# Patient Record
Sex: Male | Born: 1950 | Race: White | Hispanic: No | Marital: Married | Smoking: Never smoker
Health system: Southern US, Community
[De-identification: ages and names within clinical notes are randomized; demographics above are authoritative.]

## PROBLEM LIST (undated history)

## (undated) DIAGNOSIS — F319 Bipolar disorder, unspecified: Secondary | ICD-10-CM

## (undated) DIAGNOSIS — E785 Hyperlipidemia, unspecified: Secondary | ICD-10-CM

## (undated) DIAGNOSIS — I10 Essential (primary) hypertension: Secondary | ICD-10-CM

## (undated) DIAGNOSIS — E119 Type 2 diabetes mellitus without complications: Secondary | ICD-10-CM

## (undated) DIAGNOSIS — G4730 Sleep apnea, unspecified: Secondary | ICD-10-CM

## (undated) DIAGNOSIS — E039 Hypothyroidism, unspecified: Secondary | ICD-10-CM

## (undated) DIAGNOSIS — F329 Major depressive disorder, single episode, unspecified: Secondary | ICD-10-CM

## (undated) DIAGNOSIS — F32A Depression, unspecified: Secondary | ICD-10-CM

## (undated) HISTORY — PX: CHOLECYSTECTOMY: SHX55

---

## 2004-10-04 ENCOUNTER — Ambulatory Visit: Payer: Self-pay | Admitting: Internal Medicine

## 2005-01-05 ENCOUNTER — Ambulatory Visit: Payer: Self-pay | Admitting: Occupational Therapy

## 2010-09-05 ENCOUNTER — Inpatient Hospital Stay: Payer: Self-pay | Admitting: Unknown Physician Specialty

## 2011-03-08 ENCOUNTER — Inpatient Hospital Stay: Payer: Self-pay | Admitting: Unknown Physician Specialty

## 2011-11-04 ENCOUNTER — Inpatient Hospital Stay: Payer: Self-pay | Admitting: Psychiatry

## 2012-02-21 ENCOUNTER — Ambulatory Visit: Payer: Self-pay | Admitting: Nurse Practitioner

## 2012-06-23 ENCOUNTER — Ambulatory Visit: Payer: Self-pay | Admitting: Orthopedic Surgery

## 2012-11-08 IMAGING — CR DG KNEE COMPLETE 4+V*L*
1 series · 5 of 5 positions shown · non-contrast
Comparison: none

REASON FOR EXAM: bil knee pain
COMMENTS:

PROCEDURE:     KDR - KDXR KNEE LT COMP WITH OBLIQUES  - June 23, 2012 [DATE]
RESULT:     Comparison: None.

[Series 1: ap · 0.17mm/px · 5 of 5 slices shown]
[im 1/5]
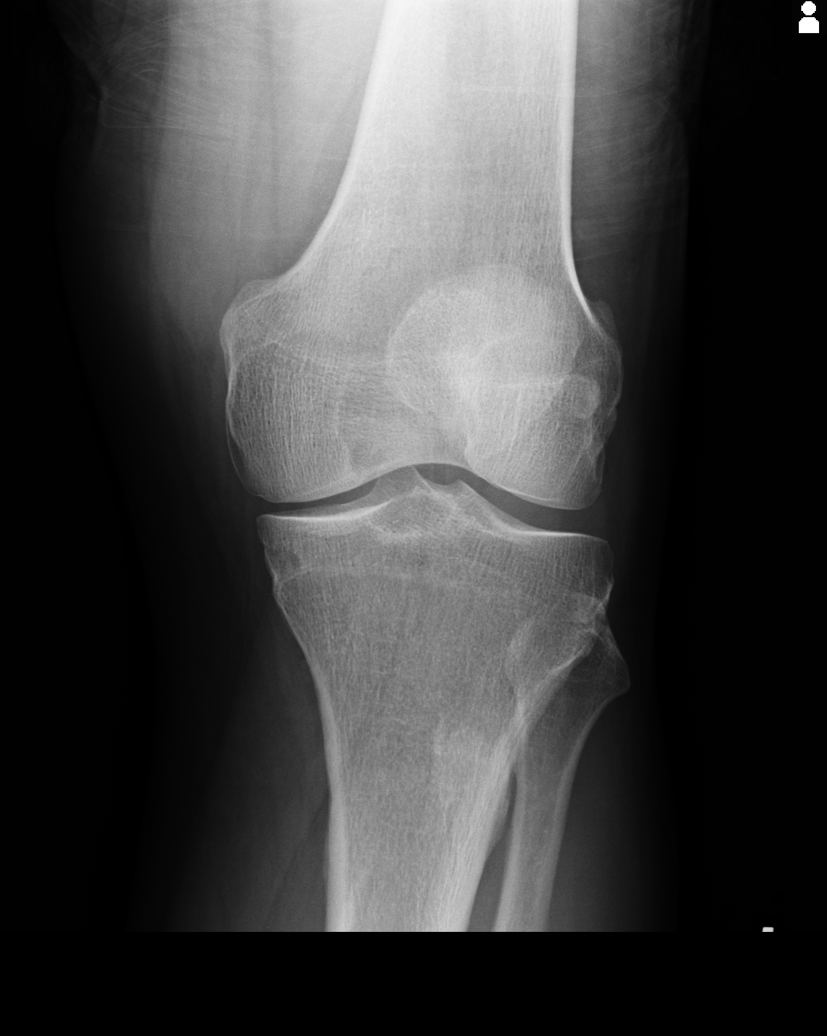
[im 2/5]
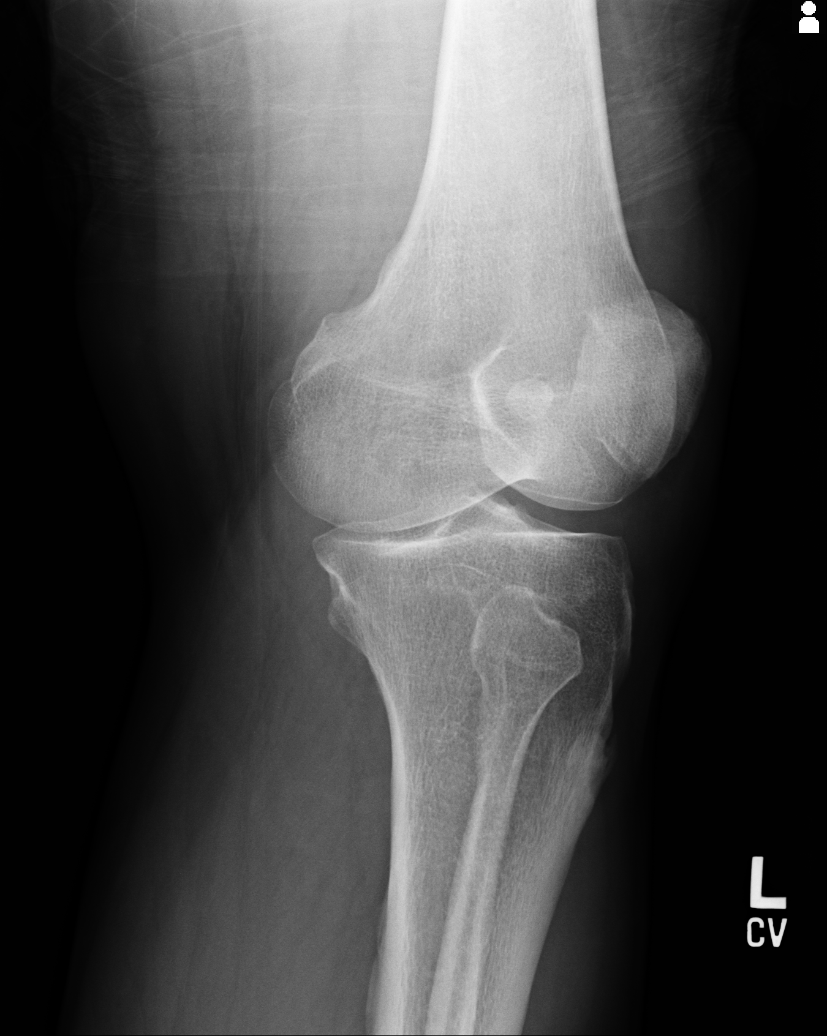
[im 3/5]
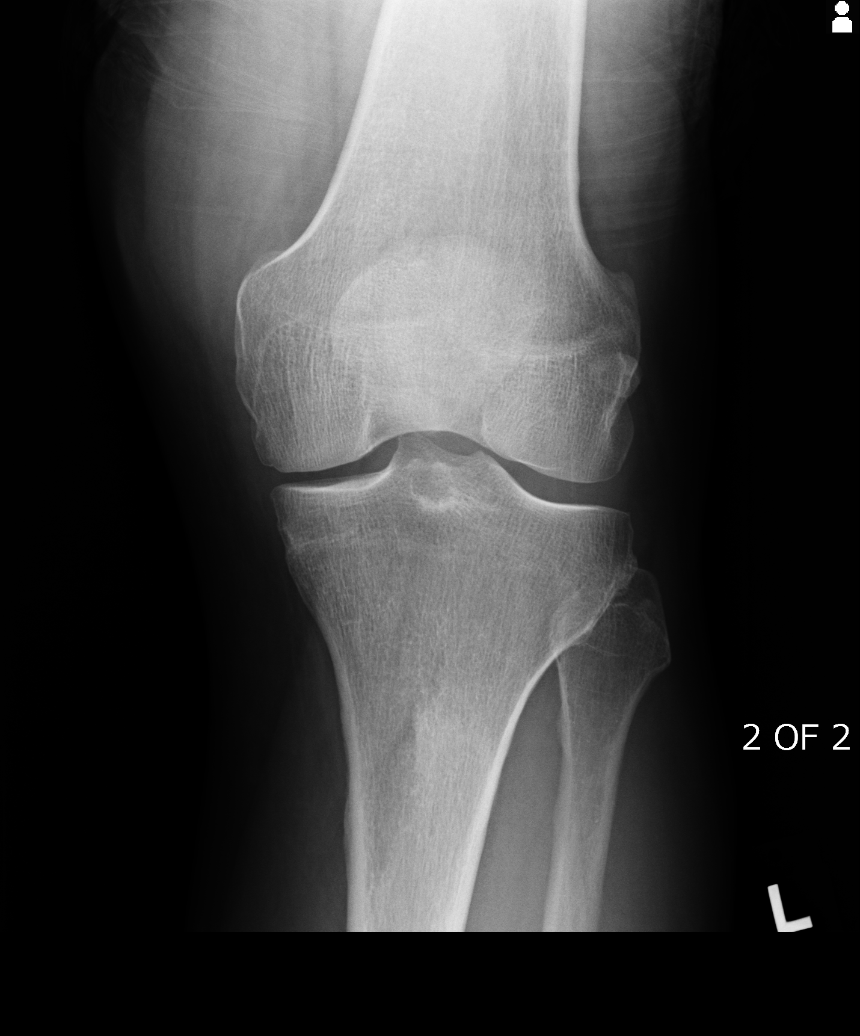
[im 4/5]
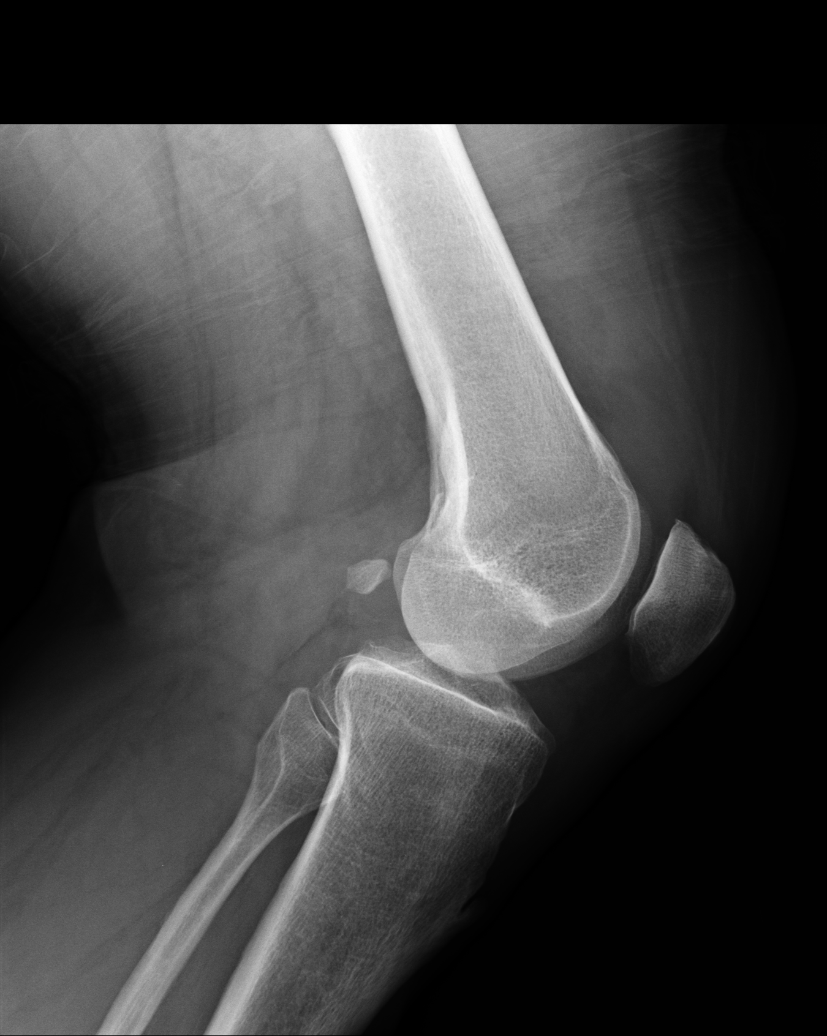
[im 5/5]
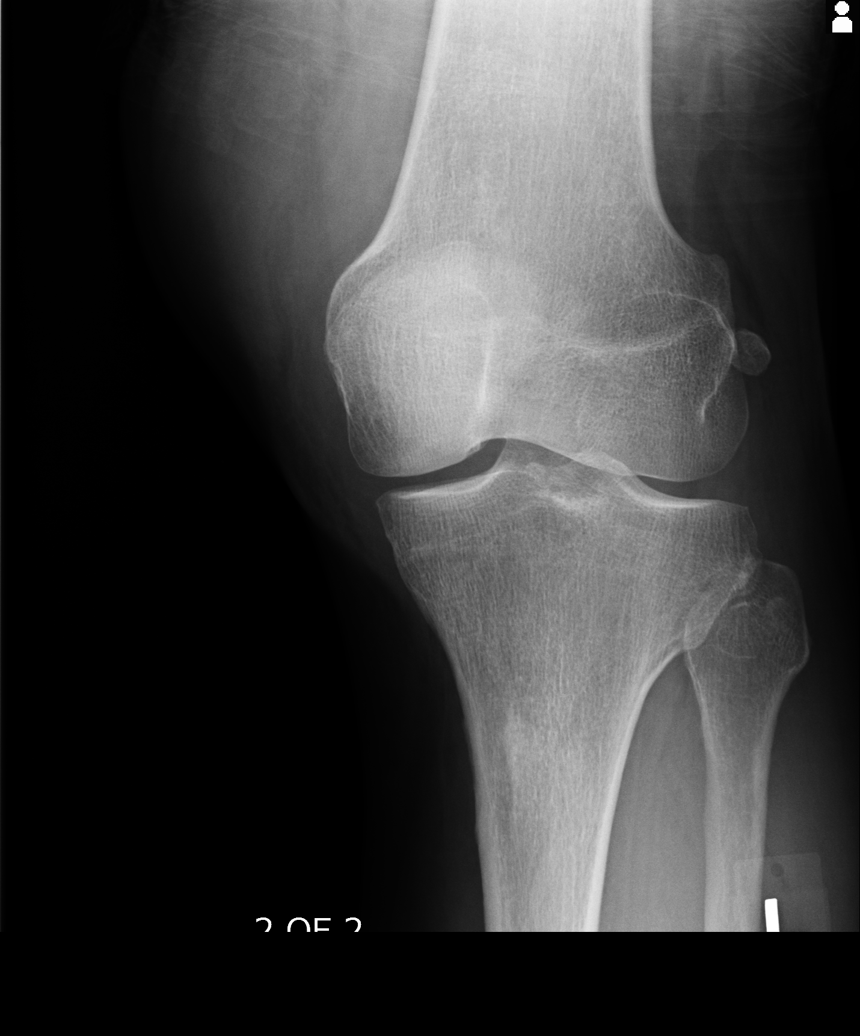

[5 of 5 positions shown; findings below may reference images not displayed]

FINDINGS: No acute fracture. The joint spaces are relatively preserved. There is a
small knee effusion.
IMPRESSION: Small knee effusion.

[REDACTED]

## 2012-11-08 IMAGING — CR DG KNEE COMPLETE 4+V*R*
1 series · 4 of 4 positions shown · non-contrast
Comparison: none

REASON FOR EXAM: bil knee pain
COMMENTS:

PROCEDURE:     KDR - KDXR KNEE RT COMP WITH OBLIQUES  - June 23, 2012 [DATE]
RESULT:     Comparison: None.

[Series 1: ap · 0.17mm/px · 4 of 4 slices shown]
[im 1/4]
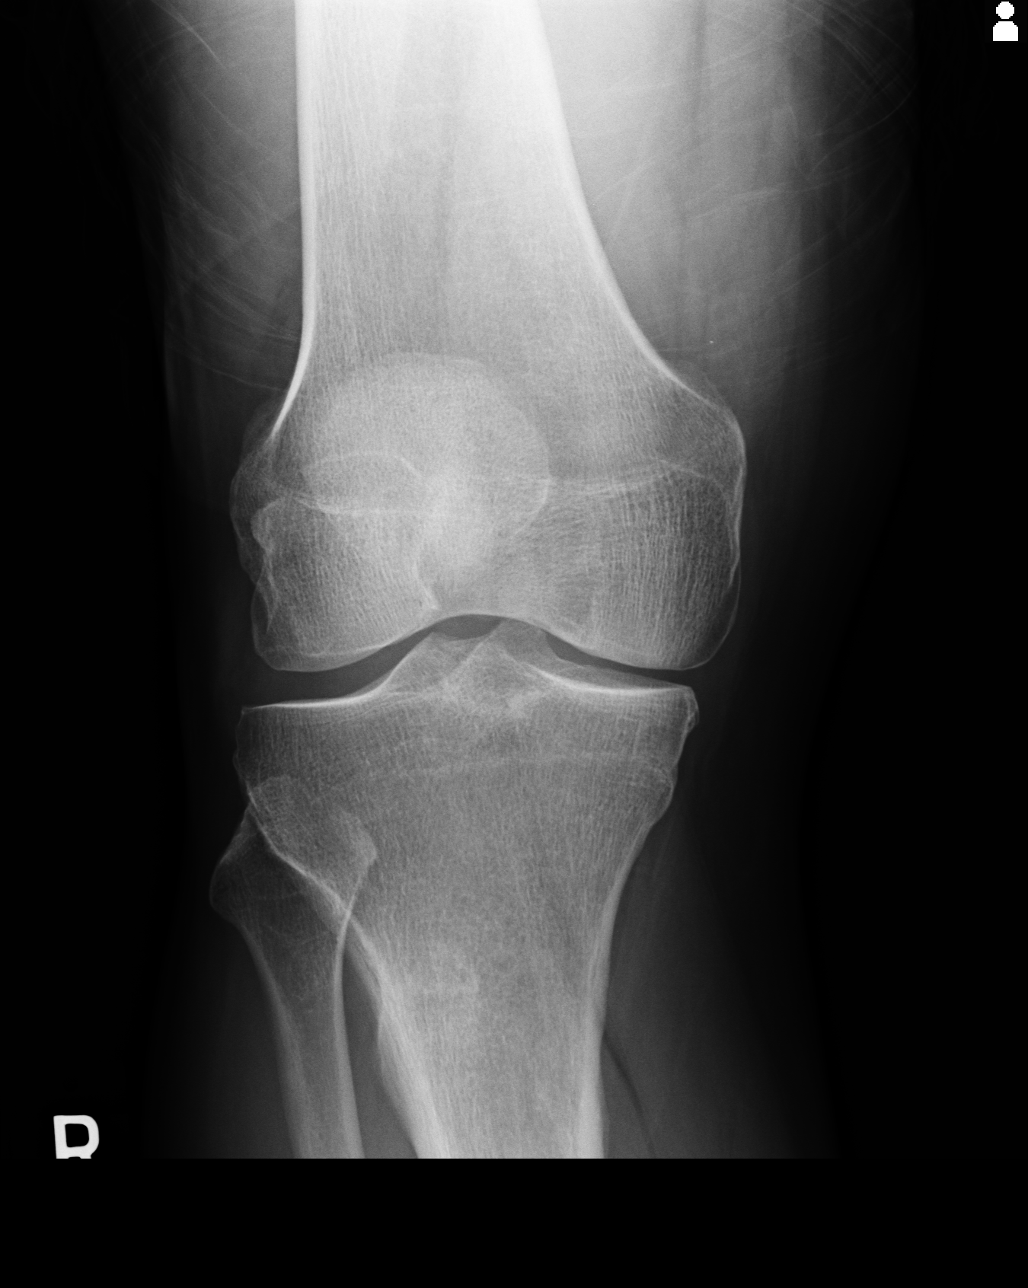
[im 2/4]
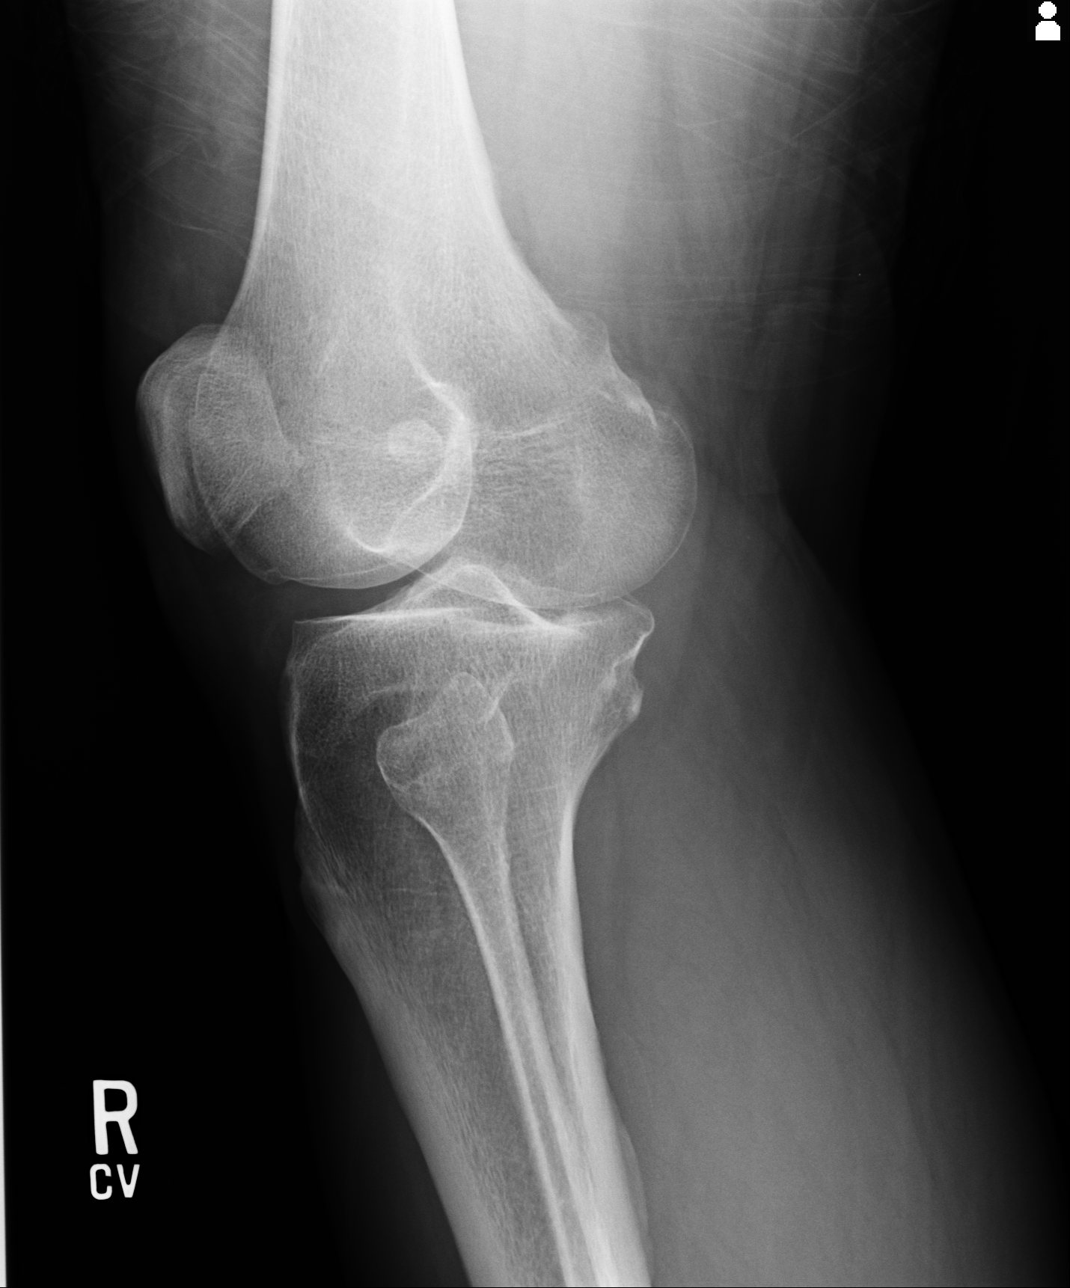
[im 3/4]
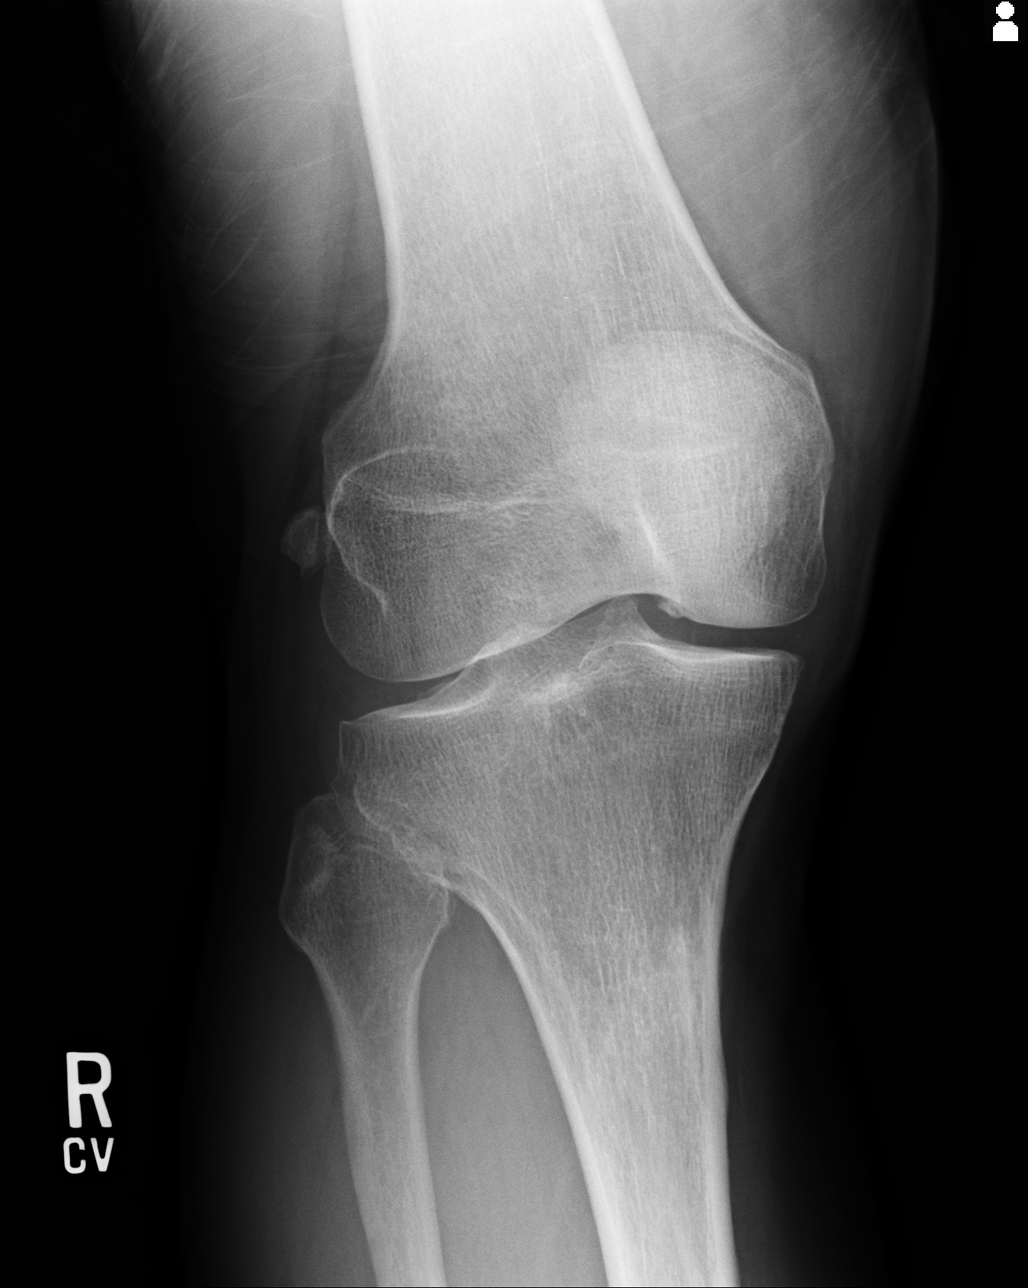
[im 4/4]
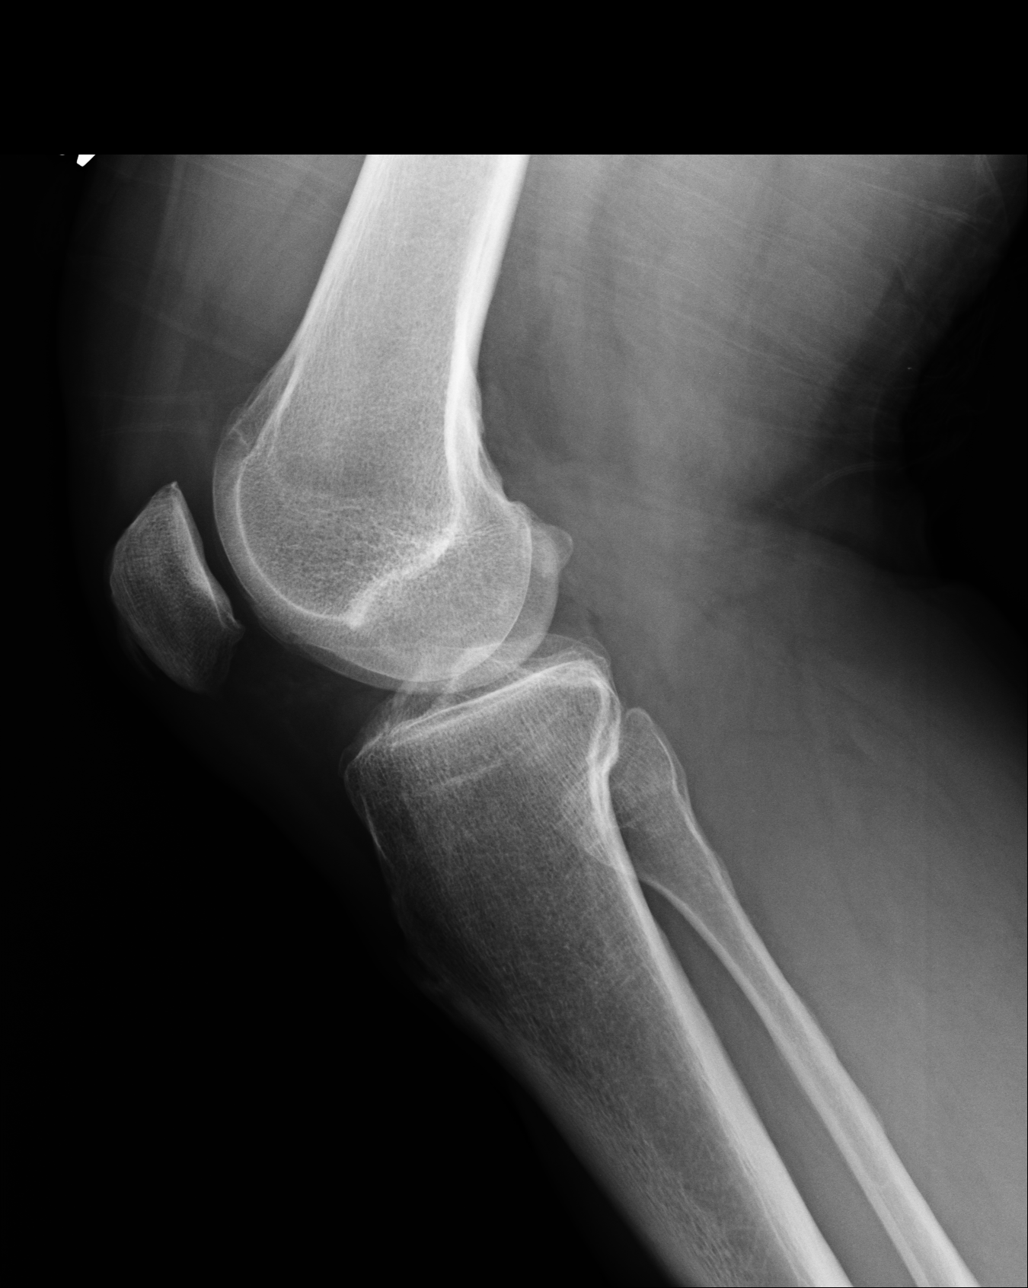

[4 of 4 positions shown; findings below may reference images not displayed]

FINDINGS: No acute fracture. There is a small osteophyte along the lateral aspect of
the medial femoral condyle. The joint spaces are relatively preserved.
Indeterminate for effusion.
IMPRESSION: Mild degenerative changes in the medial compartment.

[REDACTED].

## 2017-05-11 ENCOUNTER — Encounter: Payer: Self-pay | Admitting: *Deleted

## 2017-05-12 ENCOUNTER — Ambulatory Visit
Admission: RE | Admit: 2017-05-12 | Discharge: 2017-05-12 | Disposition: A | Discharge status: Home / Self Care | Payer: Medicare Other | Source: Ambulatory Visit | Attending: Unknown Physician Specialty | Admitting: Unknown Physician Specialty

## 2017-05-12 ENCOUNTER — Ambulatory Visit: Payer: Medicare Other | Admitting: Anesthesiology

## 2017-05-12 ENCOUNTER — Encounter: Payer: Self-pay | Admitting: *Deleted

## 2017-05-12 ENCOUNTER — Encounter
Admission: RE | Disposition: A | Discharge status: Home / Self Care | Payer: Self-pay | Source: Ambulatory Visit | Attending: Unknown Physician Specialty

## 2017-05-12 DIAGNOSIS — Z794 Long term (current) use of insulin: Secondary | ICD-10-CM | POA: Diagnosis not present

## 2017-05-12 DIAGNOSIS — D374 Neoplasm of uncertain behavior of colon: Secondary | ICD-10-CM | POA: Insufficient documentation

## 2017-05-12 DIAGNOSIS — E119 Type 2 diabetes mellitus without complications: Secondary | ICD-10-CM | POA: Insufficient documentation

## 2017-05-12 DIAGNOSIS — Z7982 Long term (current) use of aspirin: Secondary | ICD-10-CM | POA: Diagnosis not present

## 2017-05-12 DIAGNOSIS — G4730 Sleep apnea, unspecified: Secondary | ICD-10-CM | POA: Insufficient documentation

## 2017-05-12 DIAGNOSIS — F319 Bipolar disorder, unspecified: Secondary | ICD-10-CM | POA: Diagnosis not present

## 2017-05-12 DIAGNOSIS — Z79899 Other long term (current) drug therapy: Secondary | ICD-10-CM | POA: Insufficient documentation

## 2017-05-12 DIAGNOSIS — Z1211 Encounter for screening for malignant neoplasm of colon: Secondary | ICD-10-CM | POA: Diagnosis present

## 2017-05-12 DIAGNOSIS — Z9989 Dependence on other enabling machines and devices: Secondary | ICD-10-CM | POA: Insufficient documentation

## 2017-05-12 DIAGNOSIS — I10 Essential (primary) hypertension: Secondary | ICD-10-CM | POA: Diagnosis not present

## 2017-05-12 DIAGNOSIS — E785 Hyperlipidemia, unspecified: Secondary | ICD-10-CM | POA: Insufficient documentation

## 2017-05-12 DIAGNOSIS — K640 First degree hemorrhoids: Secondary | ICD-10-CM | POA: Insufficient documentation

## 2017-05-12 DIAGNOSIS — Z8719 Personal history of other diseases of the digestive system: Secondary | ICD-10-CM | POA: Diagnosis not present

## 2017-05-12 HISTORY — DX: Major depressive disorder, single episode, unspecified: F32.9

## 2017-05-12 HISTORY — DX: Essential (primary) hypertension: I10

## 2017-05-12 HISTORY — DX: Depression, unspecified: F32.A

## 2017-05-12 HISTORY — DX: Sleep apnea, unspecified: G47.30

## 2017-05-12 HISTORY — DX: Bipolar disorder, unspecified: F31.9

## 2017-05-12 HISTORY — PX: COLONOSCOPY WITH PROPOFOL: SHX5780

## 2017-05-12 HISTORY — DX: Hyperlipidemia, unspecified: E78.5

## 2017-05-12 HISTORY — DX: Type 2 diabetes mellitus without complications: E11.9

## 2017-05-12 LAB — GLUCOSE, CAPILLARY
Glucose-Capillary: 127 mg/dL — ABNORMAL HIGH (ref 65–99)
Glucose-Capillary: 110 mg/dL — ABNORMAL HIGH (ref 65–99)

## 2017-05-12 SURGERY — COLONOSCOPY WITH PROPOFOL
Anesthesia: General

## 2017-05-12 MED ORDER — PROPOFOL 500 MG/50ML IV EMUL
INTRAVENOUS | Status: DC | PRN
  Administered 2017-05-12: 170 ug/kg/min via INTRAVENOUS

## 2017-05-12 MED ORDER — PROPOFOL 500 MG/50ML IV EMUL
INTRAVENOUS | Status: AC
  Filled 2017-05-12: qty 50

## 2017-05-12 MED ORDER — LIDOCAINE 2% (20 MG/ML) 5 ML SYRINGE
INTRAMUSCULAR | Status: DC | PRN
  Administered 2017-05-12: 40 mg via INTRAVENOUS

## 2017-05-12 MED ORDER — MIDAZOLAM HCL 5 MG/5ML IJ SOLN
INTRAMUSCULAR | Status: DC | PRN
  Administered 2017-05-12: 1 mg via INTRAVENOUS

## 2017-05-12 MED ORDER — FENTANYL CITRATE (PF) 100 MCG/2ML IJ SOLN
INTRAMUSCULAR | Status: DC | PRN
  Administered 2017-05-12: 50 ug via INTRAVENOUS

## 2017-05-12 MED ORDER — PROPOFOL 10 MG/ML IV BOLUS
INTRAVENOUS | Status: DC | PRN
  Administered 2017-05-12: 100 mg via INTRAVENOUS

## 2017-05-12 MED ORDER — MIDAZOLAM HCL 2 MG/2ML IJ SOLN
INTRAMUSCULAR | Status: AC
  Filled 2017-05-12: qty 2

## 2017-05-12 MED ORDER — SODIUM CHLORIDE 0.9 % IV SOLN
INTRAVENOUS | Status: DC
  Administered 2017-05-12 (×2): via INTRAVENOUS

## 2017-05-12 MED ORDER — SODIUM CHLORIDE 0.9 % IV SOLN: INTRAVENOUS | Status: DC

## 2017-05-12 MED ORDER — FENTANYL CITRATE (PF) 100 MCG/2ML IJ SOLN
INTRAMUSCULAR | Status: AC
  Filled 2017-05-12: qty 2

## 2017-05-12 NOTE — Anesthesia Preprocedure Evaluation (Signed)
Anesthesia Evaluation  Patient identified by MRN, date of birth, ID band Patient awake    Reviewed: Allergy & Precautions, NPO status , Patient's Chart, lab work & pertinent test results  History of Anesthesia Complications Negative for: history of anesthetic complications  Airway Mallampati: III       Dental  (+) Missing, Chipped   Pulmonary sleep apnea and Continuous Positive Airway Pressure Ventilation ,           Cardiovascular hypertension, Pt. on medications      Neuro/Psych Depression Bipolar Disorder negative neurological ROS     GI/Hepatic negative GI ROS, Neg liver ROS,   Endo/Other  diabetes, Type 2, Oral Hypoglycemic Agents, Insulin Dependent  Renal/GU negative Renal ROS     Musculoskeletal   Abdominal   Peds  Hematology negative hematology ROS (+)   Anesthesia Other Findings   Reproductive/Obstetrics                             Anesthesia Physical Anesthesia Plan  ASA: III  Anesthesia Plan: General   Post-op Pain Management:    Induction: Intravenous  Airway Management Planned: Nasal Cannula  Additional Equipment:   Intra-op Plan:   Post-operative Plan:   Informed Consent: I have reviewed the patients History and Physical, chart, labs and discussed the procedure including the risks, benefits and alternatives for the proposed anesthesia with the patient or authorized representative who has indicated his/her understanding and acceptance.     Plan Discussed with:   Anesthesia Plan Comments:         Anesthesia Quick Evaluation

## 2017-05-12 NOTE — Transfer of Care (Signed)
Immediate Anesthesia Transfer of Care Note  Patient: John David  Procedure(s) Performed: Procedure(s): COLONOSCOPY WITH PROPOFOL (N/A)  Patient Location: PACU and Endoscopy Unit  Anesthesia Type:General  Level of Consciousness: sedated  Airway & Oxygen Therapy: Patient Spontanous Breathing and Patient connected to nasal cannula oxygen  Post-op Assessment: Report given to RN and Post -op Vital signs reviewed and stable  Post vital signs: Reviewed and stable  Last Vitals:  Vitals:   05/12/17 0903  BP: (!) 147/93  Pulse: 95  Resp: 18  Temp: 36.3 C    Last Pain:  Vitals:   05/12/17 0903  TempSrc: Tympanic         Complications: No apparent anesthesia complications

## 2017-05-12 NOTE — H&P (Signed)
Primary Care Physician:  Renee Rival, NP Primary Gastroenterologist:  Dr. Vira Agar  Pre-Procedure History & Physical: HPI:  John David is a 66 y.o. male is here for an colonoscopy.   Past Medical History:  Diagnosis Date  . Bipolar disorder ([place])   . Depression   . Diabetes mellitus without complication ([place])   . Hyperlipidemia   . Hypertension   . Sleep apnea     Past Surgical History:  Procedure Laterality Date  . CHOLECYSTECTOMY      Prior to Admission medications   Medication Sig Start Date End Date Taking? Authorizing Provider  Ascorbic Acid (VITAMIN C) 1000 MG tablet Take 1,000 mg by mouth daily.   Yes [provider]  aspirin EC 81 MG tablet Take 81 mg by mouth daily.   Yes [provider]  atorvastatin (LIPITOR) 40 MG tablet Take 40 mg by mouth daily.   Yes [provider]  Cinnamon 500 MG capsule Take 500 mg by mouth 2 (two) times daily. Cinnamon bark   Yes [provider]  Cinnamon Bark POWD by Does not apply route.   Yes [provider]  clonazePAM (KLONOPIN) 0.5 MG tablet Take 0.5 mg by mouth 2 (two) times daily as needed for anxiety.   Yes [provider]  Coenzyme Q10 100 MG TABS Take 1 tablet by mouth 2 (two) times daily.   Yes [provider]  enalapril (VASOTEC) 2.5 MG tablet Take 2.5 mg by mouth daily.   Yes [provider]  insulin detemir (LEVEMIR) 100 UNIT/ML injection Inject 100 Units into the skin at bedtime.   Yes [provider]  Insulin Lispro (HUMALOG KWIKPEN) 200 UNIT/ML SOPN Inject 40 Units into the skin 2 (two) times daily. With breakfast and dinner   Yes [provider]  insulin lispro (HUMALOG) 100 UNIT/ML KiwkPen Inject into the skin.   Yes [provider]  levothyroxine (SYNTHROID, LEVOTHROID) 25 MCG tablet Take 25 mcg by mouth daily before breakfast. Take on empty stomach with a glass of water at least 30-60 minutes before breakfast    Yes [provider]  Lysine 500 MG TABS Take 1 tablet by mouth 2 (two) times daily.   Yes [provider]  metFORMIN (GLUCOPHAGE) 1000 MG tablet Take 1,000 mg by mouth 2 (two) times daily with a meal.   Yes [provider]  Multiple Vitamins-Minerals (CENTRUM SILVER PO) Take 1 tablet by mouth daily.   Yes [provider]  OMEGA-3 FATTY ACIDS PO Take 1 capsule by mouth 2 (two) times daily. 340-1000mg    Yes [provider]  QUEtiapine (SEROQUEL) 200 MG tablet Take 200 mg by mouth at bedtime.   Yes [provider]  sertraline (ZOLOFT) 100 MG tablet Take 100 mg by mouth daily.   Yes [provider]  temazepam (RESTORIL) 30 MG capsule Take 30 mg by mouth at bedtime as needed for sleep.   Yes [provider]    Allergies as of 03/08/2017  . (Not on File)    History reviewed. No pertinent family history.  Social History   Social History  . Marital status: Married    Spouse name: N/A  . Number of children: N/A  . Years of education: N/A   Occupational History  . Not on file.   Social History Main Topics  . Smoking status: Never Smoker  . Smokeless tobacco: Never Used  . Alcohol use No  . Drug use: No  . Sexual  activity: Not on file   Other Topics Concern  . Not on file   Social History Narrative  . No narrative on file    Review of Systems: See HPI, otherwise negative ROS  Physical Exam: BP (!) 147/93   Pulse 95   Temp 97.4 F (36.3 C) (Tympanic)   Resp 18   Ht 5\' 10"  (1.778 m)   Wt 117.9 kg (260 lb)   SpO2 97%   BMI 37.31 kg/m  General:   Alert,  pleasant and cooperative in NAD Head:  Normocephalic and atraumatic. Neck:  Supple; no masses or thyromegaly. Lungs:  Clear throughout to auscultation.    Heart:  Regular rate and rhythm. Abdomen:  Soft, nontender and nondistended. Normal bowel sounds, without guarding, and without rebound.   Neurologic:  Alert and  oriented x4;  grossly normal  neurologically.  Impression/Plan: John David is here for an colonoscopy to be performed for [facility] colon polyps  Risks, benefits, limitations, and alternatives regarding  colonoscopy have been reviewed with the patient.  Questions have been answered.  All parties agreeable.   Gaylyn Cheers, MD  05/12/2017, 10:39 AM

## 2017-05-12 NOTE — Anesthesia Postprocedure Evaluation (Signed)
Anesthesia Post Note  Patient: John David  Procedure(s) Performed: Procedure(s) (LRB): COLONOSCOPY WITH PROPOFOL (N/A)  Patient location during evaluation: Endoscopy Anesthesia Type: General Level of consciousness: awake and alert Pain management: pain level controlled Vital Signs Assessment: post-procedure vital signs reviewed and stable Respiratory status: spontaneous breathing and respiratory function stable Cardiovascular status: stable Anesthetic complications: no     Last Vitals:  Vitals:   05/12/17 1120 05/12/17 1130  BP: 122/84   Pulse: 93 91  Resp: 13 (!) 26  Temp:      Last Pain:  Vitals:   05/12/17 1110  TempSrc: Tympanic                 Tynasia Mccaul K

## 2017-05-12 NOTE — Op Note (Signed)
[facility]astroenter[facility] Patient Name: John David Procedure Date: 05/12/2017 10:39 AM MRN: [id number] Account #: [id number] Date of Birth: 10/12/1951 Admit Type: Outpatient Age: 66 Room: [facility] ENDO ROOM 4 Gender: Male Note Status: Finalized Procedure:            Colonoscopy Indications:          High risk colon cancer surveillance: Personal history                        of colonic polyps Providers:            Manya Silvas, MD Referring MD:         Renee Rival (Referring MD) Medicines:            Propofol per Anesthesia Complications:        No immediate complications. Procedure:            Pre-Anesthesia Assessment:                       - After reviewing the risks and benefits, the patient                        was deemed in satisfactory condition to undergo the                        procedure.                       After obtaining informed consent, the colonoscope was                        passed under direct vision. Throughout the procedure,                        the patient's blood pressure, pulse, and oxygen                        saturations were monitored continuously. The                        Colonoscope was introduced through the anus and                        advanced to the the cecum, identified by appendiceal                        orifice and ileocecal valve. The colonoscopy was                        performed without difficulty. The patient tolerated the                        procedure well. The quality of the bowel preparation                        was good. Findings:      A small polyp was found in the proximal ascending colon. The polyp was       sessile. The polyp was removed with a cold snare. Resection and       retrieval were complete.      Internal hemorrhoids were found during  endoscopy. The hemorrhoids were       small and Grade I (internal hemorrhoids that do not prolapse).      The exam was otherwise  without abnormality. Impression:           - One small polyp in the proximal ascending colon,                        removed with a cold snare. Resected and retrieved.                       - Internal hemorrhoids.                       - The examination was otherwise normal. Recommendation:       - Await pathology results. Probably repeat in 5 years. Manya Silvas, MD 05/12/2017 11:09:08 AM This report has been signed electronically. Number of Addenda: 0 Note Initiated On: 05/12/2017 10:39 AM Scope Withdrawal Time: 0 hours 10 minutes 1 second  Total Procedure Duration: 0 hours 19 minutes 42 seconds       [facility]

## 2017-05-12 NOTE — Anesthesia Post-op Follow-up Note (Cosign Needed)
Anesthesia QCDR form completed.        

## 2017-05-13 ENCOUNTER — Encounter: Payer: Self-pay | Admitting: Unknown Physician Specialty

## 2017-05-13 LAB — SURGICAL PATHOLOGY

## 2022-09-02 ENCOUNTER — Other Ambulatory Visit: Payer: Self-pay

## 2022-09-02 ENCOUNTER — Emergency Department
Admission: EM | Admit: 2022-09-02 | Discharge: 2022-09-02 | Disposition: A | Discharge status: Home / Self Care | Payer: Medicare PPO | Source: Home / Self Care | Attending: Emergency Medicine | Admitting: Emergency Medicine

## 2022-09-02 DIAGNOSIS — R112 Nausea with vomiting, unspecified: Secondary | ICD-10-CM | POA: Insufficient documentation

## 2022-09-02 DIAGNOSIS — R61 Generalized hyperhidrosis: Secondary | ICD-10-CM | POA: Insufficient documentation

## 2022-09-02 DIAGNOSIS — R197 Diarrhea, unspecified: Secondary | ICD-10-CM | POA: Insufficient documentation

## 2022-09-02 DIAGNOSIS — E109 Type 1 diabetes mellitus without complications: Secondary | ICD-10-CM | POA: Insufficient documentation

## 2022-09-02 DIAGNOSIS — R5383 Other fatigue: Secondary | ICD-10-CM | POA: Insufficient documentation

## 2022-09-02 DIAGNOSIS — I10 Essential (primary) hypertension: Secondary | ICD-10-CM | POA: Insufficient documentation

## 2022-09-02 DIAGNOSIS — K5732 Diverticulitis of large intestine without perforation or abscess without bleeding: Secondary | ICD-10-CM | POA: Diagnosis not present

## 2022-09-02 LAB — CBC WITH DIFFERENTIAL/PLATELET
Abs Immature Granulocytes: 0.03 10*3/uL (ref 0.00–0.07)
Basophils Absolute: 0 10*3/uL (ref 0.0–0.1)
Basophils Relative: 1 %
Eosinophils Absolute: 0.1 10*3/uL (ref 0.0–0.5)
Eosinophils Relative: 1 %
HCT: 43.9 % (ref 39.0–52.0)
Hemoglobin: 15 g/dL (ref 13.0–17.0)
Immature Granulocytes: 0 %
Lymphocytes Relative: 24 %
Lymphs Abs: 2.1 10*3/uL (ref 0.7–4.0)
MCH: 32.7 pg (ref 26.0–34.0)
MCHC: 34.2 g/dL (ref 30.0–36.0)
MCV: 95.6 fL (ref 80.0–100.0)
Monocytes Absolute: 0.7 10*3/uL (ref 0.1–1.0)
Monocytes Relative: 7 %
Neutro Abs: 5.9 10*3/uL (ref 1.7–7.7)
Neutrophils Relative %: 67 %
Platelets: 311 10*3/uL (ref 150–400)
RBC: 4.59 MIL/uL (ref 4.22–5.81)
RDW: 13.2 % (ref 11.5–15.5)
WBC: 8.8 10*3/uL (ref 4.0–10.5)
nRBC: 0 % (ref 0.0–0.2)

## 2022-09-02 LAB — COMPREHENSIVE METABOLIC PANEL
ALT: 63 U/L — ABNORMAL HIGH (ref 0–44)
AST: 68 U/L — ABNORMAL HIGH (ref 15–41)
Albumin: 4.8 g/dL (ref 3.5–5.0)
Alkaline Phosphatase: 61 U/L (ref 38–126)
Anion gap: 13 (ref 5–15)
BUN: 21 mg/dL (ref 8–23)
CO2: 21 mmol/L — ABNORMAL LOW (ref 22–32)
Calcium: 9.8 mg/dL (ref 8.9–10.3)
Chloride: 102 mmol/L (ref 98–111)
Creatinine, Ser: 0.98 mg/dL (ref 0.61–1.24)
GFR, Estimated: >60 mL/min (ref >60)
Glucose, Bld: 211 mg/dL — ABNORMAL HIGH (ref 70–99)
Potassium: 4 mmol/L (ref 3.5–5.1)
Sodium: 136 mmol/L (ref 135–145)
Total Bilirubin: 1.1 mg/dL (ref 0.3–1.2)
Total Protein: 8 g/dL (ref 6.5–8.1)

## 2022-09-02 LAB — CBG MONITORING, ED: Glucose-Capillary: 205 mg/dL — ABNORMAL HIGH (ref 70–99)

## 2022-09-02 LAB — TROPONIN I (HIGH SENSITIVITY): Troponin I (High Sensitivity): 5 ng/L (ref <18)

## 2022-09-02 MED ORDER — PANTOPRAZOLE SODIUM 40 MG IV SOLR
40.0000 mg | Freq: Once | INTRAVENOUS | Status: AC
  Administered 2022-09-02: 40 mg via INTRAVENOUS
  Filled 2022-09-02: qty 10

## 2022-09-02 MED ORDER — ONDANSETRON 4 MG PO TBDP: 4.0000 mg | ORAL_TABLET | Freq: Three times a day (TID) | ORAL | 0 refills | Status: DC | PRN

## 2022-09-02 MED ORDER — FAMOTIDINE 20 MG PO TABS: 20.0000 mg | ORAL_TABLET | Freq: Two times a day (BID) | ORAL | 0 refills | Status: AC

## 2022-09-02 MED ORDER — ONDANSETRON HCL 4 MG/2ML IJ SOLN
4.0000 mg | Freq: Once | INTRAMUSCULAR | Status: AC
  Administered 2022-09-02: 4 mg via INTRAVENOUS
  Filled 2022-09-02: qty 2

## 2022-09-02 MED ORDER — LACTATED RINGERS IV BOLUS
1000.0000 mL | Freq: Once | INTRAVENOUS | Status: AC
  Administered 2022-09-02: 1000 mL via INTRAVENOUS

## 2022-09-02 NOTE — ED Notes (Signed)
Pt A&O, IV removed, pt given discharge instructions, pt assisted to vehicle by RN. 

## 2022-09-02 NOTE — ED Notes (Signed)
Orthostatic vitals  Laying HR 84 RR 18 O2 94% BP 132/63 (83) Temp 98.1  Sitting HR 97 RR 18 BP 130/96 (106) O2 94%  Standing HR 91 RR 16 BP 134/69 (89) O2 95%

## 2022-09-02 NOTE — ED Triage Notes (Signed)
Pt co generalized weakness and n.v.d since yesterday. STates weakness to both legs, pt very diaphoretic in triage. Denies any pain or shob at this time. Pt vomiting in triage.

## 2022-09-02 NOTE — ED Provider Notes (Signed)
[facility] Provider Note    Event Date/Time   First MD Initiated Contact with Patient 09/02/22 1405     (approximate)   History   Chief Complaint: Nausea   HPI  ANDREJ SPAGNOLI is a 71 y.o. male with a history of hypertension, insulin-dependent diabetes, bipolar disorder who comes the ED complaining of nausea vomiting and diarrhea since yesterday which started after eating meatloaf with mashed potatoes and lima beans for dinner.  Shortly thereafter he started vomiting which continued every few hours through the night.  This morning he started feeling very fatigued and having diaphoresis.  Denies any chest pain abdominal pain or other pain complaints.  No shortness of breath or syncope, no trauma.  No fever.     Physical Exam   Triage Vital Signs: ED Triage Vitals [09/02/22 1343]  Enc Vitals Group     BP (!) 151/92     Pulse Rate 79     Resp 20     Temp 98.3 F (36.8 C)     Temp Source Oral     SpO2 98 %     Weight 235 lb (106.6 kg)     Height '5\' 10"'$  (1.778 m)     Head Circumference      Peak Flow      Pain Score 0     Pain Loc      Pain Edu?      Excl. in [place]?     Most recent vital signs: Vitals:   09/02/22 1343  BP: (!) 151/92  Pulse: 79  Resp: 20  Temp: 98.3 F (36.8 C)  SpO2: 98%    General: Awake, no distress.  CV:  Good peripheral perfusion.  Regular rate and rhythm.  Normal peripheral pulses. Resp:  Normal effort.  Clear to auscultation bilaterally Abd:  No distention.  Soft and nontender Other:  Dry mucous membranes.  No peripheral edema, no rash.   ED Results / Procedures / Treatments   Labs (all labs ordered are listed, but only abnormal results are displayed) Labs Reviewed  COMPREHENSIVE METABOLIC PANEL - Abnormal; Notable for the following components:      Result Value   CO2 21 (*)    Glucose, Bld 211 (*)    AST 68 (*)    ALT 63 (*)    All other components within normal limits  CBG MONITORING, ED -  Abnormal; Notable for the following components:   Glucose-Capillary 205 (*)    All other components within normal limits  CBC WITH DIFFERENTIAL/PLATELET  TROPONIN I (HIGH SENSITIVITY)     EKG Interpreted by me Normal sinus rhythm rate of 95.  Normal axis and intervals.  Normal QRS ST segments and T waves.   RADIOLOGY    PROCEDURES:  Procedures   MEDICATIONS ORDERED IN ED: Medications  lactated ringers bolus 1,000 mL (1,000 mLs Intravenous New Bag/Given 09/02/22 1443)  ondansetron (ZOFRAN) injection 4 mg (4 mg Intravenous Given 09/02/22 1443)  pantoprazole (PROTONIX) injection 40 mg (40 mg Intravenous Given 09/02/22 1442)     IMPRESSION / MDM / ASSESSMENT AND PLAN / ED COURSE  I reviewed the triage vital signs and the nursing notes.                              Differential diagnosis includes, but is not limited to, dehydration, AKI, electrolyte abnormality, anemia  Patient's presentation is most consistent with acute presentation  with potential threat to life or bodily function.  Patient presents with vomiting and diaphoresis, most likely foodborne illness/viral gastroenteritis.  Doubt ACS PE dissection AAA mesenteric ischemia or surgical abdomen.  In the ED vital signs and labs are all unremarkable.  His symptoms are noncardiac, and high-sensitivity troponin after more than 12 hours of symptoms is normal.   After IV fluids and Zofran patient is feeling much better.  We will do p.o. trial with orthostatic vital signs.  If reassuring he can be discharged with continued supportive care at home.        FINAL CLINICAL IMPRESSION(S) / ED DIAGNOSES   Final diagnoses:  Nausea and vomiting, unspecified vomiting type     Rx / DC Orders   ED Discharge Orders          Ordered    famotidine (PEPCID) 20 MG tablet  2 times daily        09/02/22 1535    ondansetron (ZOFRAN-ODT) 4 MG disintegrating tablet  Every 8 hours PRN        09/02/22 1535             Note:   This document was prepared using Dragon voice recognition software and may include unintentional dictation errors.   Carrie Mew, MD 09/02/22 1537

## 2022-09-02 NOTE — ED Provider Notes (Signed)
Patient signed out to me pending fluids p.o. challenge and likely discharge.  This is a 71 year old male presenting with nausea vomiting diarrhea.  No abdominal pain abdomen is benign and lab work is reassuring.  Patient was somewhat symptomatic with standing however he was able to walk and ambulate.  On my assessment he is eating and drinking and looks well.  Denies ongoing vomiting.  He does have a friend who is at bedside who can drive him home.  I think he is appropriate for discharge at this time.  Will prescribe ODT Zofran.  We discussed return precautions.   Rada Hay, MD 09/02/22 [phone]

## 2022-09-04 ENCOUNTER — Emergency Department: Payer: Medicare PPO

## 2022-09-04 ENCOUNTER — Encounter: Payer: Self-pay | Admitting: Emergency Medicine

## 2022-09-04 ENCOUNTER — Other Ambulatory Visit: Payer: Self-pay

## 2022-09-04 ENCOUNTER — Inpatient Hospital Stay
Admission: EM | Admit: 2022-09-04 | Discharge: 2022-09-09 | DRG: 392 | Disposition: A | Discharge status: Home Health | Payer: Medicare PPO | Attending: Internal Medicine | Admitting: Internal Medicine

## 2022-09-04 DIAGNOSIS — Z79899 Other long term (current) drug therapy: Secondary | ICD-10-CM

## 2022-09-04 DIAGNOSIS — E119 Type 2 diabetes mellitus without complications: Secondary | ICD-10-CM

## 2022-09-04 DIAGNOSIS — E785 Hyperlipidemia, unspecified: Secondary | ICD-10-CM | POA: Diagnosis present

## 2022-09-04 DIAGNOSIS — R7989 Other specified abnormal findings of blood chemistry: Secondary | ICD-10-CM | POA: Diagnosis present

## 2022-09-04 DIAGNOSIS — Z9049 Acquired absence of other specified parts of digestive tract: Secondary | ICD-10-CM

## 2022-09-04 DIAGNOSIS — K5792 Diverticulitis of intestine, part unspecified, without perforation or abscess without bleeding: Secondary | ICD-10-CM | POA: Diagnosis present

## 2022-09-04 DIAGNOSIS — F319 Bipolar disorder, unspecified: Secondary | ICD-10-CM | POA: Diagnosis present

## 2022-09-04 DIAGNOSIS — Z794 Long term (current) use of insulin: Secondary | ICD-10-CM

## 2022-09-04 DIAGNOSIS — F32A Depression, unspecified: Secondary | ICD-10-CM | POA: Diagnosis present

## 2022-09-04 DIAGNOSIS — Z6835 Body mass index (BMI) 35.0-35.9, adult: Secondary | ICD-10-CM

## 2022-09-04 DIAGNOSIS — I10 Essential (primary) hypertension: Secondary | ICD-10-CM | POA: Diagnosis present

## 2022-09-04 DIAGNOSIS — K5732 Diverticulitis of large intestine without perforation or abscess without bleeding: Principal | ICD-10-CM | POA: Diagnosis present

## 2022-09-04 DIAGNOSIS — Z7984 Long term (current) use of oral hypoglycemic drugs: Secondary | ICD-10-CM

## 2022-09-04 DIAGNOSIS — K219 Gastro-esophageal reflux disease without esophagitis: Secondary | ICD-10-CM | POA: Diagnosis present

## 2022-09-04 DIAGNOSIS — G4733 Obstructive sleep apnea (adult) (pediatric): Secondary | ICD-10-CM | POA: Diagnosis present

## 2022-09-04 DIAGNOSIS — E279 Disorder of adrenal gland, unspecified: Secondary | ICD-10-CM | POA: Diagnosis present

## 2022-09-04 DIAGNOSIS — Z79890 Hormone replacement therapy (postmenopausal): Secondary | ICD-10-CM

## 2022-09-04 DIAGNOSIS — E860 Dehydration: Secondary | ICD-10-CM | POA: Diagnosis present

## 2022-09-04 DIAGNOSIS — Z7982 Long term (current) use of aspirin: Secondary | ICD-10-CM

## 2022-09-04 DIAGNOSIS — E278 Other specified disorders of adrenal gland: Secondary | ICD-10-CM | POA: Diagnosis present

## 2022-09-04 DIAGNOSIS — E669 Obesity, unspecified: Secondary | ICD-10-CM | POA: Diagnosis present

## 2022-09-04 DIAGNOSIS — E876 Hypokalemia: Secondary | ICD-10-CM | POA: Diagnosis present

## 2022-09-04 DIAGNOSIS — E039 Hypothyroidism, unspecified: Secondary | ICD-10-CM | POA: Diagnosis present

## 2022-09-04 HISTORY — DX: Hypothyroidism, unspecified: E03.9

## 2022-09-04 LAB — COMPREHENSIVE METABOLIC PANEL
ALT: 56 U/L — ABNORMAL HIGH (ref 0–44)
AST: 63 U/L — ABNORMAL HIGH (ref 15–41)
Albumin: 3.9 g/dL (ref 3.5–5.0)
Alkaline Phosphatase: 54 U/L (ref 38–126)
Anion gap: 9 (ref 5–15)
BUN: 18 mg/dL (ref 8–23)
CO2: 22 mmol/L (ref 22–32)
Calcium: 8.8 mg/dL — ABNORMAL LOW (ref 8.9–10.3)
Chloride: 105 mmol/L (ref 98–111)
Creatinine, Ser: 0.85 mg/dL (ref 0.61–1.24)
GFR, Estimated: >60 mL/min (ref >60)
Glucose, Bld: 146 mg/dL — ABNORMAL HIGH (ref 70–99)
Potassium: 3.4 mmol/L — ABNORMAL LOW (ref 3.5–5.1)
Sodium: 136 mmol/L (ref 135–145)
Total Bilirubin: 0.6 mg/dL (ref 0.3–1.2)
Total Protein: 6.6 g/dL (ref 6.5–8.1)

## 2022-09-04 LAB — C DIFFICILE QUICK SCREEN W PCR REFLEX
C Diff antigen: NEGATIVE
C Diff interpretation: NOT DETECTED
C Diff toxin: NEGATIVE

## 2022-09-04 LAB — CBC WITH DIFFERENTIAL/PLATELET
Abs Immature Granulocytes: 0.03 10*3/uL (ref 0.00–0.07)
Basophils Absolute: 0.1 10*3/uL (ref 0.0–0.1)
Basophils Relative: 1 %
Eosinophils Absolute: 0.2 10*3/uL (ref 0.0–0.5)
Eosinophils Relative: 2 %
HCT: 38 % — ABNORMAL LOW (ref 39.0–52.0)
Hemoglobin: 13 g/dL (ref 13.0–17.0)
Immature Granulocytes: 0 %
Lymphocytes Relative: 21 %
Lymphs Abs: 1.8 10*3/uL (ref 0.7–4.0)
MCH: 32.8 pg (ref 26.0–34.0)
MCHC: 34.2 g/dL (ref 30.0–36.0)
MCV: 96 fL (ref 80.0–100.0)
Monocytes Absolute: 0.8 10*3/uL (ref 0.1–1.0)
Monocytes Relative: 9 %
Neutro Abs: 6 10*3/uL (ref 1.7–7.7)
Neutrophils Relative %: 67 %
Platelets: 235 10*3/uL (ref 150–400)
RBC: 3.96 MIL/uL — ABNORMAL LOW (ref 4.22–5.81)
RDW: 13.3 % (ref 11.5–15.5)
WBC: 8.8 10*3/uL (ref 4.0–10.5)
nRBC: 0 % (ref 0.0–0.2)

## 2022-09-04 LAB — HEPATITIS PANEL, ACUTE
HCV Ab: NONREACTIVE
Hep A IgM: NONREACTIVE
Hep B C IgM: NONREACTIVE
Hepatitis B Surface Ag: NONREACTIVE

## 2022-09-04 LAB — HEMOGLOBIN A1C
Hgb A1c MFr Bld: 6.3 % — ABNORMAL HIGH (ref 4.8–5.6)
Mean Plasma Glucose: 134.11 mg/dL

## 2022-09-04 LAB — LIPASE, BLOOD: Lipase: 36 U/L (ref 11–51)

## 2022-09-04 LAB — GLUCOSE, CAPILLARY
Glucose-Capillary: 131 mg/dL — ABNORMAL HIGH (ref 70–99)
Glucose-Capillary: 133 mg/dL — ABNORMAL HIGH (ref 70–99)
Glucose-Capillary: 132 mg/dL — ABNORMAL HIGH (ref 70–99)

## 2022-09-04 LAB — MAGNESIUM: Magnesium: 2.2 mg/dL (ref 1.7–2.4)

## 2022-09-04 MED ORDER — MORPHINE SULFATE (PF) 2 MG/ML IV SOLN: 1.0000 mg | INTRAVENOUS | Status: DC | PRN

## 2022-09-04 MED ORDER — INSULIN GLARGINE-YFGN 100 UNIT/ML ~~LOC~~ SOLN
25.0000 [IU] | Freq: Every day | SUBCUTANEOUS | Status: DC
  Administered 2022-09-04 – 2022-09-08 (×5): 25 [IU] via SUBCUTANEOUS
  Filled 2022-09-04 (×5): qty 0.25

## 2022-09-04 MED ORDER — ONDANSETRON HCL 4 MG/2ML IJ SOLN
4.0000 mg | INTRAMUSCULAR | Status: DC
  Administered 2022-09-04: 4 mg via INTRAVENOUS
  Filled 2022-09-04: qty 2

## 2022-09-04 MED ORDER — SODIUM CHLORIDE 0.9 % IV SOLN
2.0000 g | INTRAVENOUS | Status: DC
  Administered 2022-09-04 – 2022-09-08 (×5): 2 g via INTRAVENOUS
  Filled 2022-09-04 (×3): qty 20
  Filled 2022-09-04 (×2): qty 2

## 2022-09-04 MED ORDER — FAMOTIDINE 20 MG PO TABS
20.0000 mg | ORAL_TABLET | Freq: Two times a day (BID) | ORAL | Status: DC
  Administered 2022-09-04 – 2022-09-09 (×11): 20 mg via ORAL
  Filled 2022-09-04 (×11): qty 1

## 2022-09-04 MED ORDER — ONDANSETRON HCL 4 MG/2ML IJ SOLN
4.0000 mg | Freq: Three times a day (TID) | INTRAMUSCULAR | Status: DC | PRN
  Administered 2022-09-04 – 2022-09-07 (×3): 4 mg via INTRAVENOUS
  Filled 2022-09-04 (×3): qty 2

## 2022-09-04 MED ORDER — SODIUM CHLORIDE 0.9 % IV SOLN
12.5000 mg | Freq: Three times a day (TID) | INTRAVENOUS | Status: DC | PRN
  Filled 2022-09-04 (×2): qty 0.5

## 2022-09-04 MED ORDER — METRONIDAZOLE 500 MG/100ML IV SOLN
500.0000 mg | Freq: Two times a day (BID) | INTRAVENOUS | Status: DC
  Administered 2022-09-04 – 2022-09-09 (×10): 500 mg via INTRAVENOUS
  Filled 2022-09-04 (×11): qty 100

## 2022-09-04 MED ORDER — CLONAZEPAM 0.5 MG PO TABS: 0.5000 mg | ORAL_TABLET | Freq: Two times a day (BID) | ORAL | Status: DC | PRN

## 2022-09-04 MED ORDER — SERTRALINE HCL 50 MG PO TABS
100.0000 mg | ORAL_TABLET | Freq: Every day | ORAL | Status: DC
  Administered 2022-09-04 – 2022-09-09 (×6): 100 mg via ORAL
  Filled 2022-09-04 (×6): qty 2

## 2022-09-04 MED ORDER — TEMAZEPAM 30 MG PO CAPS
30.0000 mg | ORAL_CAPSULE | Freq: Every evening | ORAL | Status: DC | PRN
Start: 2022-09-04 — End: 2022-09-09

## 2022-09-04 MED ORDER — INSULIN ASPART 100 UNIT/ML IJ SOLN
0.0000 [IU] | Freq: Three times a day (TID) | INTRAMUSCULAR | Status: DC
  Administered 2022-09-04 – 2022-09-08 (×6): 1 [IU] via SUBCUTANEOUS
  Filled 2022-09-04 (×6): qty 1

## 2022-09-04 MED ORDER — ACETAMINOPHEN 325 MG PO TABS: 650.0000 mg | ORAL_TABLET | Freq: Four times a day (QID) | ORAL | Status: DC | PRN

## 2022-09-04 MED ORDER — AMOXICILLIN-POT CLAVULANATE 875-125 MG PO TABS: 1.0000 | ORAL_TABLET | Freq: Two times a day (BID) | ORAL | 0 refills | Status: DC

## 2022-09-04 MED ORDER — POTASSIUM CHLORIDE CRYS ER 20 MEQ PO TBCR
40.0000 meq | EXTENDED_RELEASE_TABLET | Freq: Once | ORAL | Status: AC
  Administered 2022-09-04: 40 meq via ORAL
  Filled 2022-09-04: qty 2

## 2022-09-04 MED ORDER — QUETIAPINE FUMARATE ER 300 MG PO TB24
800.0000 mg | ORAL_TABLET | Freq: Every day | ORAL | Status: DC
  Administered 2022-09-04 – 2022-09-08 (×5): 800 mg via ORAL
  Filled 2022-09-04 (×5): qty 1

## 2022-09-04 MED ORDER — SODIUM CHLORIDE 0.9 % IV BOLUS
1000.0000 mL | Freq: Once | INTRAVENOUS | Status: AC
  Administered 2022-09-04: 1000 mL via INTRAVENOUS

## 2022-09-04 MED ORDER — ENALAPRIL MALEATE 20 MG PO TABS
20.0000 mg | ORAL_TABLET | Freq: Every day | ORAL | Status: DC
  Administered 2022-09-04: 20 mg via ORAL
  Filled 2022-09-04 (×2): qty 1

## 2022-09-04 MED ORDER — LEVOTHYROXINE SODIUM 25 MCG PO TABS
25.0000 ug | ORAL_TABLET | Freq: Every day | ORAL | Status: DC
  Administered 2022-09-05 – 2022-09-09 (×5): 25 ug via ORAL
  Filled 2022-09-04 (×5): qty 1

## 2022-09-04 MED ORDER — ENOXAPARIN SODIUM 60 MG/0.6ML IJ SOSY
0.5000 mg/kg | PREFILLED_SYRINGE | INTRAMUSCULAR | Status: DC
  Administered 2022-09-04 – 2022-09-08 (×5): 55 mg via SUBCUTANEOUS
  Filled 2022-09-04 (×5): qty 0.6

## 2022-09-04 MED ORDER — AMOXICILLIN-POT CLAVULANATE 875-125 MG PO TABS
1.0000 | ORAL_TABLET | Freq: Once | ORAL | Status: AC
  Administered 2022-09-04: 1 via ORAL
  Filled 2022-09-04: qty 1

## 2022-09-04 MED ORDER — IBUPROFEN 400 MG PO TABS: 200.0000 mg | ORAL_TABLET | Freq: Four times a day (QID) | ORAL | Status: DC | PRN

## 2022-09-04 MED ORDER — GABAPENTIN 300 MG PO CAPS
300.0000 mg | ORAL_CAPSULE | Freq: Every day | ORAL | Status: DC
  Administered 2022-09-04 – 2022-09-08 (×5): 300 mg via ORAL
  Filled 2022-09-04 (×5): qty 1

## 2022-09-04 MED ORDER — HYDRALAZINE HCL 20 MG/ML IJ SOLN
5.0000 mg | INTRAMUSCULAR | Status: DC | PRN
Start: 2022-09-04 — End: 2022-09-09

## 2022-09-04 MED ORDER — ATORVASTATIN CALCIUM 20 MG PO TABS
40.0000 mg | ORAL_TABLET | Freq: Every day | ORAL | Status: DC
  Administered 2022-09-04 – 2022-09-09 (×6): 40 mg via ORAL
  Filled 2022-09-04 (×6): qty 2

## 2022-09-04 MED ORDER — SODIUM CHLORIDE 0.9 % IV BOLUS
500.0000 mL | Freq: Once | INTRAVENOUS | Status: AC
  Administered 2022-09-04: 500 mL via INTRAVENOUS

## 2022-09-04 MED ORDER — INSULIN ASPART 100 UNIT/ML IJ SOLN: 0.0000 [IU] | Freq: Every day | INTRAMUSCULAR | Status: DC

## 2022-09-04 MED ORDER — ACETAMINOPHEN 10 MG/ML IV SOLN
1000.0000 mg | Freq: Four times a day (QID) | INTRAVENOUS | Status: DC
  Administered 2022-09-04: 1000 mg via INTRAVENOUS
  Filled 2022-09-04 (×2): qty 100

## 2022-09-04 MED ORDER — SODIUM CHLORIDE 0.9 % IV SOLN: INTRAVENOUS | Status: DC

## 2022-09-04 MED ORDER — ONDANSETRON HCL 4 MG/2ML IJ SOLN
4.0000 mg | INTRAMUSCULAR | Status: AC
  Administered 2022-09-04: 4 mg via INTRAVENOUS
  Filled 2022-09-04: qty 2

## 2022-09-04 MED ORDER — IOHEXOL 300 MG/ML  SOLN
100.0000 mL | Freq: Once | INTRAMUSCULAR | Status: AC | PRN
  Administered 2022-09-04: 100 mL via INTRAVENOUS

## 2022-09-04 MED ORDER — TRAMADOL HCL 50 MG PO TABS: 50.0000 mg | ORAL_TABLET | Freq: Three times a day (TID) | ORAL | Status: DC | PRN

## 2022-09-04 NOTE — Assessment & Plan Note (Signed)
Patient has mild abnormal liver function, likely due to ongoing infection. -Avoid using Tylenol -Check hepatitis panel

## 2022-09-04 NOTE — Assessment & Plan Note (Addendum)
Potassium 3.4 -Repleted potassium -Check magnesium level --> 2.2

## 2022-09-04 NOTE — Assessment & Plan Note (Addendum)
[facility] ER record.  Blood sugar 106-205 in ED.  Patient is taking metformin, Humalog, Levemir 40 units daily at home -Sliding scale insulin -Glargine insulin 25 units daily

## 2022-09-04 NOTE — H&P (Signed)
History and Physical    John David:[id number] DOB: 1951-02-04 DOA: 09/04/2022  Referring MD/NP/PA:   PCP: John Rival, NP   Patient coming from:  The patient is coming from home.  At baseline, pt is independent for most of ADL.        Chief Complaint: Nausea, vomiting, diarrhea, abdominal pain  HPI: John David is a 71 y.o. male with medical history significant of hypertension, hyperlipidemia, diabetes mellitus, GERD, hypothyroidism, depression, bipolar, OSA, obesity obesity BMI 35.15, who presents with nausea, vomiting, diarrhea and abdominal pain.  Patient has nausea, vomiting, diarrhea for almost 4 days, initially did have abdominal pain.  Patient was seen in the ED on 9/6, and was discharged home on Zofran.  Patient continues to have nausea, vomiting, diarrhea.  Patient states that mostly he has dry heaves, with a few times of watery diarrhea each day.  He also developed abdominal pain, which is located in the left lower quadrant, constant, mild to moderate, sharp, nonradiating.  Patient does not have fever or chills.  No rectal bleeding.  Denies chest pain, cough, shortness breath.  No symptoms of UTI  Data reviewed independently and ED Course: pt was found to have WBC 8.8, GFR> 60, potassium 3.4, abnormal liver function (ALP 54, AST 63, ALT 56, total bilirubin 0.6).  Temperature normal, blood pressure 145/78, heart rate 85, RR 24, oxygen saturation 99% on room air.  CT scan for abdomen/pelvis that showed mild diverticulits and possible small left adrenal nodule.  Patient is placed on MedSurg bed for patient  CT-abd/pelvis: 1. Potential very mild diverticulitis of the descending colon. Subtle findings. 2. Small LEFT adrenal nodule cannot be fully characterize. Consider MRI adrenal protocol for further characterization.  EKG: I have personally reviewed.  Sinus rhythm, QTc 447, low voltage.   Review of Systems:   General: no fevers, chills, no body weight  gain, has poor appetite, has fatigue HEENT: no blurry vision, hearing changes or sore throat Respiratory: no dyspnea, coughing, wheezing CV: no chest pain, no palpitations GI: has nausea, vomiting, abdominal pain, diarrhea, no constipation GU: no dysuria, burning on urination, increased urinary frequency, hematuria  Ext: no leg edema Neuro: no unilateral weakness, numbness, or tingling, no vision change or hearing loss Skin: no rash, no skin tear. MSK: No muscle spasm, no deformity, no limitation of range of movement in spin Heme: No easy bruising.  Travel history: No recent long distant travel.   Allergy: No Known Allergies  Past Medical History:  Diagnosis Date   Bipolar disorder ([place])    Depression    Diabetes mellitus without complication ([place])    Hyperlipidemia    Hypertension    Hypothyroidism    Sleep apnea     Past Surgical History:  Procedure Laterality Date   CHOLECYSTECTOMY     COLONOSCOPY WITH PROPOFOL N/A 05/12/2017   Procedure: COLONOSCOPY WITH PROPOFOL;  Surgeon: Manya Silvas, MD;  Location: [facility] ENDOSCOPY;  Service: Endoscopy;  Laterality: N/A;    Social History:  reports that he has never smoked. He has never used smokeless tobacco. He reports that he does not drink alcohol and does not use drugs.  Family History: No family history on file.  I have tried to review with patient about family medical history, but patient states that he does not know his family medical history.  Prior to Admission medications   Medication Sig Start Date End Date Taking? Authorizing Provider  amoxicillin-clavulanate (AUGMENTIN) 875-125 MG tablet Take 1 tablet  by mouth 2 (two) times daily for 10 days. 09/04/22 09/14/22 Yes Delman Kitten, MD  Ascorbic Acid (VITAMIN C) 1000 MG tablet Take 1,000 mg by mouth daily.    [provider]  aspirin EC 81 MG tablet Take 81 mg by mouth daily.    [provider]  atorvastatin (LIPITOR) 40 MG tablet Take 40 mg by mouth daily.     [provider]  Cinnamon 500 MG capsule Take 500 mg by mouth 2 (two) times daily. Cinnamon bark    [provider]  Cinnamon Bark POWD by Does not apply route.    [provider]  clonazePAM (KLONOPIN) 0.5 MG tablet Take 0.5 mg by mouth 2 (two) times daily as needed for anxiety.    [provider]  Coenzyme Q10 100 MG TABS Take 1 tablet by mouth 2 (two) times daily.    [provider]  enalapril (VASOTEC) 2.5 MG tablet Take 2.5 mg by mouth daily.    [provider]  famotidine (PEPCID) 20 MG tablet Take 1 tablet (20 mg total) by mouth 2 (two) times daily. 09/02/22   John Mew, MD  insulin detemir (LEVEMIR) 100 UNIT/ML injection Inject 100 Units into the skin at bedtime.    [provider]  Insulin Lispro (HUMALOG KWIKPEN) 200 UNIT/ML SOPN Inject 40 Units into the skin 2 (two) times daily. With breakfast and dinner    [provider]  insulin lispro (HUMALOG) 100 UNIT/ML KiwkPen Inject into the skin.    [provider]  levothyroxine (SYNTHROID, LEVOTHROID) 25 MCG tablet Take 25 mcg by mouth daily before breakfast. Take on empty stomach with a glass of water at least 30-60 minutes before breakfast    [provider]  Lysine 500 MG TABS Take 1 tablet by mouth 2 (two) times daily.    [provider]  metFORMIN (GLUCOPHAGE) 1000 MG tablet Take 1,000 mg by mouth 2 (two) times daily with a meal.    [provider]  Multiple Vitamins-Minerals (CENTRUM SILVER PO) Take 1 tablet by mouth daily.    [provider]  OMEGA-3 FATTY ACIDS PO Take 1 capsule by mouth 2 (two) times daily. 340-'1000mg'$     [provider]  ondansetron (ZOFRAN-ODT) 4 MG disintegrating tablet Take 1 tablet (4 mg total) by mouth every 8 (eight) hours as needed for nausea or vomiting. 09/02/22   John Mew, MD  QUEtiapine (SEROQUEL) 200 MG tablet Take 200 mg by mouth at bedtime.    [provider]  sertraline (ZOLOFT) 100 MG tablet Take 100 mg by mouth daily.    [provider]  temazepam (RESTORIL) 30 MG capsule Take 30 mg by mouth at bedtime as needed for sleep.    [provider]    Physical Exam: Vitals:   09/04/22 0625 09/04/22 0931 09/04/22 1000 09/04/22 1135  BP: (!) 151/60 (!) 145/78  (!) 145/68  Pulse: 85 70 70 71  Resp: 20 (!) 24 (!) 22 17  Temp: 97.7 F (36.5 C) 98.7 F (37.1 C)  97.9 F (36.6 C)  TempSrc: Oral Oral    SpO2: 97% 96% 98% 98%  Weight:      Height:       General: Not in acute distress.  Dry mucous membrane HEENT:       Eyes: PERRL, EOMI, no scleral icterus.       ENT: No discharge from the ears and nose, no pharynx injection, no tonsillar enlargement.  Neck: No JVD, no bruit, no mass felt. Heme: No neck lymph node enlargement. Cardiac: S1/S2, RRR, No murmurs, No gallops or rubs. Respiratory: No rales, wheezing, rhonchi or rubs. GI: Soft, nondistended, has tenderness in left lower quadrant, no rebound pain, no organomegaly, BS present. GU: No hematuria Ext: No pitting leg edema bilaterally. 1+DP/PT pulse bilaterally. Musculoskeletal: No joint deformities, No joint redness or warmth, no limitation of ROM in spin. Skin: No rashes.  Neuro: Alert, oriented X3, cranial nerves II-XII grossly intact, moves all extremities normally. M Psych: Patient is not psychotic, no suicidal or hemocidal ideation.  Labs on Admission: I have personally reviewed following labs and imaging studies  CBC: Recent Labs  Lab 09/02/22 1348 09/04/22 0647  WBC 8.8 8.8  NEUTROABS 5.9 6.0  HGB 15.0 13.0  HCT 43.9 38.0*  MCV 95.6 96.0  PLT 311 315   Basic Metabolic Panel: Recent Labs  Lab 09/02/22 1348 09/04/22 0647  NA 136 136  K 4.0 3.4*  CL 102 105  CO2 21* 22  GLUCOSE 211* 146*  BUN 21 18  CREATININE 0.98 0.85  CALCIUM 9.8 8.8*  MG  --  2.2   GFR: Estimated Creatinine Clearance: 99.4 mL/min (by C-G formula based on SCr of  0.85 mg/dL). Liver Function Tests: Recent Labs  Lab 09/02/22 1348 09/04/22 0647  AST 68* 63*  ALT 63* 56*  ALKPHOS 61 54  BILITOT 1.1 0.6  PROT 8.0 6.6  ALBUMIN 4.8 3.9   Recent Labs  Lab 09/04/22 0647  LIPASE 36   No results for input(s): "AMMONIA" in the last 168 hours. Coagulation Profile: No results for input(s): "INR", "PROTIME" in the last 168 hours. Cardiac Enzymes: No results for input(s): "CKTOTAL", "CKMB", "CKMBINDEX", "TROPONINI" in the last 168 hours. BNP (last 3 results) No results for input(s): "PROBNP" in the last 8760 hours. HbA1C: No results for input(s): "HGBA1C" in the last 72 hours. CBG: Recent Labs  Lab 09/02/22 1339 09/04/22 1223  GLUCAP 205* 131*   Lipid Profile: No results for input(s): "CHOL", "HDL", "LDLCALC", "TRIG", "CHOLHDL", "LDLDIRECT" in the last 72 hours. Thyroid Function Tests: No results for input(s): "TSH", "T4TOTAL", "FREET4", "T3FREE", "THYROIDAB" in the last 72 hours. Anemia Panel: No results for input(s): "VITAMINB12", "FOLATE", "FERRITIN", "TIBC", "IRON", "RETICCTPCT" in the last 72 hours. Urine analysis: No results found for: "COLORURINE", "APPEARANCEUR", "LABSPEC", "PHURINE", "GLUCOSEU", "HGBUR", "BILIRUBINUR", "KETONESUR", "PROTEINUR", "UROBILINOGEN", "NITRITE", "LEUKOCYTESUR" Sepsis Labs: '@LABRCNTIP'$ (procalcitonin:4,lacticidven:4) )No results found for this or any previous visit (from the past 240 hour(s)).   Radiological Exams on Admission: CT ABDOMEN PELVIS W CONTRAST  Result Date: 09/04/2022 CLINICAL DATA:  LEFT lower quadrant abdominal pain EXAM: CT ABDOMEN AND PELVIS WITH CONTRAST TECHNIQUE: Multidetector CT imaging of the abdomen and pelvis was performed using the standard protocol following bolus administration of intravenous contrast. RADIATION DOSE REDUCTION: This exam was performed according to the departmental dose-optimization program which includes automated exposure control, adjustment of the mA and/or kV  according to patient size and/or use of iterative reconstruction technique. CONTRAST:  14m OMNIPAQUE IOHEXOL 300 MG/ML  SOLN COMPARISON:  None Available. FINDINGS: Lower chest: Lung bases are clear. Hepatobiliary: No focal hepatic lesion. Postcholecystectomy. No biliary dilatation. Pancreas: Pancreas is normal. No ductal dilatation. No pancreatic inflammation. Spleen: Normal spleen Adrenals/urinary tract: 13 mm rounded enhancing nodule of the LEFT adrenal gland cannot be fully characterized on this CT protocol. Nonenhancing cyst of the RIGHT renal cortex measures 8 mm. Ureters and bladder normal. Stomach/Bowel: Stomach, small bowel, appendix, and cecum are normal.  Several diverticula of the descending colon. There is minimal thickening of the peritoneal reflection along the anti mesenteric border of the descending colon. For example image 57/2 and image 59/5. Marland Kitchen Vascular/Lymphatic: Abdominal aorta is normal caliber. No periportal or retroperitoneal adenopathy. No pelvic adenopathy. Reproductive: Prostate unremarkable. Other: No free fluid. Musculoskeletal: No aggressive osseous lesion. IMPRESSION: 1. Potential very mild diverticulitis of the descending colon. Subtle findings. 2. Small LEFT adrenal nodule cannot be fully characterize. Consider MRI adrenal protocol for further characterization. Electronically Signed   By: Suzy Bouchard M.D.   On: 09/04/2022 08:57      Assessment/Plan Principal Problem:   Acute diverticulitis Active Problems:   Diabetes mellitus without complication ([place])   Hyperlipidemia   Hypertension   Hypothyroidism   Depression   Bipolar disorder ([place])   Obesity with body mass index (BMI) of 30.0 to 39.9   Abnormal LFTs   Hypokalemia   Adrenal nodule ([facility])   Assessment and Plan: * Acute diverticulitis CT scan showed possible mild diverticulitis.  Patient is clinically dehydrated.  Will need IV fluid.  -Placed on MedSurg bed follow-up patient -IV Rocephin and Flagyl  (patient received 1 dose of oral Augmentin in ED) -As needed Zofran and morphine -IV fluid: 1.5 L normal saline -Follow-up C. difficile and GI pathogen panel  Diabetes mellitus without complication ([place]) [facility] ER record.  Blood sugar 106-205 in ED.  Patient is taking metformin, Humalog, Levemir 40 units daily at home -Sliding scale insulin -Glargine insulin 25 units daily  Hyperlipidemia - Lipitor  Hypertension - IV hydralazine as needed -Enalapril -Hold HCTZ since patient need IV fluid  Hypothyroidism - Synthroid  Depression Depression and bipolar: -Continue home medications: Klonopin, Seroquel, Zoloft  Bipolar disorder ([place]) - See above  Obesity with body mass index (BMI) of 30.0 to 39.9  BMI= 35.15   and BW= 111.1 -Diet and exercise.   -Encourage to lose weight.   Abnormal LFTs Patient has mild abnormal liver function, likely due to ongoing infection. -Avoid using Tylenol -Check hepatitis panel  Hypokalemia Potassium 3.4 -Repleted potassium -Check magnesium level --> 2.2  Adrenal nodule ([facility]) CT showed a small LEFT adrenal nodule, cannot be fully characterize.  - will need to f/u with PCP as outpt work up            DVT ppx: SQ Lovenox  Code Status: Full code  Family Communication: I have tried to call her daughter who did not pick up the phone, I left a message to her.  Disposition Plan:  Anticipate discharge back to previous environment  Consults called:  none  Admission status and Level of care: Med-Surg:   for obs as inpt        Dispo: The patient is from: Home              Anticipated d/c is to: Home              Anticipated d/c date is: 1 day              Patient currently is not medically stable to d/c.    Severity of Illness:  The appropriate patient status for this patient is OBSERVATION. Observation status is judged to be reasonable and necessary in order to provide the required intensity of service to ensure the  patient's safety. The patient's presenting symptoms, physical exam findings, and initial radiographic and laboratory data in the context of their medical condition is felt to place them at decreased risk for further  clinical deterioration. Furthermore, it is anticipated that the patient will be medically stable for discharge from the hospital within 2 midnights of admission.        Date of Service 09/04/2022    [facility]   If 7PM-7AM, please contact night-coverage [email or web address] 09/04/2022, 2:09 PM

## 2022-09-04 NOTE — Assessment & Plan Note (Addendum)
Lipitor 

## 2022-09-04 NOTE — Assessment & Plan Note (Addendum)
Synthroid 

## 2022-09-04 NOTE — Assessment & Plan Note (Addendum)
-   IV hydralazine as needed -Enalapril -Hold HCTZ since patient need IV fluid

## 2022-09-04 NOTE — Assessment & Plan Note (Signed)
See above

## 2022-09-04 NOTE — Discharge Instructions (Addendum)
We believe your symptoms are caused by diverticulitis.  Most of the time this condition (please read through the included information) can be cured with outpatient antibiotics.  Please take the full course of prescribed medication(s) and follow up with the doctors recommended above.  Return to the ED if your abdominal pain worsens or fails to improve, you develop bloody vomiting, bloody diarrhea, you are unable to tolerate fluids due to vomiting, fever greater than 101, or other symptoms that concern you.    Additionally, a small nodule was noted on your left adrenal gland.  It is important that you discuss this with your primary doctor, our radiologist is recommending that you have further testing such as an MRI to ensure that this is not something that is potentially pre/cancerous.

## 2022-09-04 NOTE — ED Triage Notes (Signed)
Patient ambulatory to triage with steady gait, without difficulty or distress noted; pt report N/V and chills; st here 2 days ago for same with no abnormal findings

## 2022-09-04 NOTE — Assessment & Plan Note (Signed)
CT scan showed possible mild diverticulitis.  Patient is clinically dehydrated.  Will need IV fluid.  -Placed on MedSurg bed follow-up patient -IV Rocephin and Flagyl (patient received 1 dose of oral Augmentin in ED) -As needed Zofran and morphine -IV fluid: 1.5 L normal saline -Follow-up C. difficile and GI pathogen panel

## 2022-09-04 NOTE — Assessment & Plan Note (Addendum)
Depression and bipolar: -Continue home medications: Klonopin, Seroquel, Zoloft

## 2022-09-04 NOTE — Assessment & Plan Note (Signed)
  BMI= 35.15   and BW= 111.1 -Diet and exercise.   -Encourage to lose weight.

## 2022-09-04 NOTE — ED Provider Notes (Signed)
[facility] Provider Note    Event Date/Time   First MD Initiated Contact with Patient 09/04/22 0700     (approximate)   History   Emesis   HPI  John David is a 71 y.o. male who on review of previous medical history has a history of diabetes.  Was also seen at the ER 2 days ago for nausea and vomitin.  Was given prescription for ondansetron  Patient reports had approximately 4 days of nausea and vomiting.  Reports she did not really have much abdominal pain, but today his stomach has been a little sore perhaps from all the vomiting.  He has been feeling chills and sweats off-and-on.  No chest pain no trouble breathing.  No headache.    Continues to vomit, reports after leaving the hospital he has not been able to keep anything on his stomach no food at all for at least the last 2 days.  Feels fatigued.  Reports his diabetes has been under good control       Physical Exam   Triage Vital Signs: ED Triage Vitals  Enc Vitals Group     BP 09/04/22 0625 (!) 151/60     Pulse Rate 09/04/22 0625 85     Resp 09/04/22 0625 20     Temp 09/04/22 0625 97.7 F (36.5 C)     Temp Source 09/04/22 0625 Oral     SpO2 09/04/22 0625 97 %     Weight 09/04/22 0621 245 lb (111.1 kg)     Height 09/04/22 0621 '5\' 10"'$  (1.778 m)     Head Circumference --      Peak Flow --      Pain Score 09/04/22 0621 0     Pain Loc --      Pain Edu? --      Excl. in [place]? --     Most recent vital signs: Vitals:   09/04/22 0625 09/04/22 0931  BP: (!) 151/60 (!) 145/78  Pulse: 85 70  Resp: 20 (!) 24  Temp: 97.7 F (36.5 C) 98.7 F (37.1 C)  SpO2: 97% 96%     General: Awake, no distress.  He appears slightly diaphoretic and nauseated but in no acute distress though. CV:  Good peripheral perfusion.  Normal tone Resp:  Normal effort.  Clear bilateral Abd:  No distention.  Soft throughout but reports moderate tenderness in the suprapubic and provide a left lower  quadrant without rebound or guarding.  No focal pain in the right upper quadrant McBurney's point. Other:  Warm well-perfused extremities   ED Results / Procedures / Treatments   Labs (all labs ordered are listed, but only abnormal results are displayed) Labs Reviewed  CBC WITH DIFFERENTIAL/PLATELET - Abnormal; Notable for the following components:      Result Value   RBC 3.96 (*)    HCT 38.0 (*)    All other components within normal limits  COMPREHENSIVE METABOLIC PANEL - Abnormal; Notable for the following components:   Potassium 3.4 (*)    Glucose, Bld 146 (*)    Calcium 8.8 (*)    AST 63 (*)    ALT 56 (*)    All other components within normal limits  GASTROINTESTINAL PANEL BY PCR, STOOL (REPLACES STOOL CULTURE)  C DIFFICILE QUICK SCREEN W PCR REFLEX    LIPASE, BLOOD  URINALYSIS, ROUTINE W REFLEX MICROSCOPIC  CBG MONITORING, ED  CBG MONITORING, ED  CBG MONITORING, ED  CBG MONITORING, ED  CBG MONITORING, ED  CBG MONITORING, ED   CBC interpreted as normal white count normal hemoglobin normal platelets    EKG  Interpreted by me at 9:20 AM heart rate 70 QRS 100 QTc 440 Normal sinus rhythm no evidence of acute ischemia or ectopy   RADIOLOGY  Personally interpreted the patient's CT abdomen pelvis for gross acute pathology and I did not find any.  However radiologist able to delineate additional details  CT ABDOMEN PELVIS W CONTRAST  Result Date: 09/04/2022 CLINICAL DATA:  LEFT lower quadrant abdominal pain EXAM: CT ABDOMEN AND PELVIS WITH CONTRAST TECHNIQUE: Multidetector CT imaging of the abdomen and pelvis was performed using the standard protocol following bolus administration of intravenous contrast. RADIATION DOSE REDUCTION: This exam was performed according to the departmental dose-optimization program which includes automated exposure control, adjustment of the mA and/or kV according to patient size and/or use of iterative reconstruction technique. CONTRAST:  174m  OMNIPAQUE IOHEXOL 300 MG/ML  SOLN COMPARISON:  None Available. FINDINGS: Lower chest: Lung bases are clear. Hepatobiliary: No focal hepatic lesion. Postcholecystectomy. No biliary dilatation. Pancreas: Pancreas is normal. No ductal dilatation. No pancreatic inflammation. Spleen: Normal spleen Adrenals/urinary tract: 13 mm rounded enhancing nodule of the LEFT adrenal gland cannot be fully characterized on this CT protocol. Nonenhancing cyst of the RIGHT renal cortex measures 8 mm. Ureters and bladder normal. Stomach/Bowel: Stomach, small bowel, appendix, and cecum are normal. Several diverticula of the descending colon. There is minimal thickening of the peritoneal reflection along the anti mesenteric border of the descending colon. For example image 57/2 and image 59/5. .Marland KitchenVascular/Lymphatic: Abdominal aorta is normal caliber. No periportal or retroperitoneal adenopathy. No pelvic adenopathy. Reproductive: Prostate unremarkable. Other: No free fluid. Musculoskeletal: No aggressive osseous lesion. IMPRESSION: 1. Potential very mild diverticulitis of the descending colon. Subtle findings. 2. Small LEFT adrenal nodule cannot be fully characterize. Consider MRI adrenal protocol for further characterization. Electronically Signed   By: SSuzy BouchardM.D.   On: 09/04/2022 08:57      PROCEDURES:  Critical Care performed: No  Procedures   MEDICATIONS ORDERED IN ED: Medications  acetaminophen (OFIRMEV) IV 1,000 mg (0 mg Intravenous Stopped 09/04/22 0905)  ondansetron (ZOFRAN) injection 4 mg (has no administration in time range)  ondansetron (ZOFRAN) injection 4 mg (4 mg Intravenous Given 09/04/22 0744)  sodium chloride 0.9 % bolus 1,000 mL (0 mLs Intravenous Stopped 09/04/22 0905)  iohexol (OMNIPAQUE) 300 MG/ML solution 100 mL (100 mLs Intravenous Contrast Given 09/04/22 0817)  amoxicillin-clavulanate (AUGMENTIN) 875-125 MG per tablet 1 tablet (1 tablet Oral Given 09/04/22 0910)     IMPRESSION / MDM /  ASSESSMENT AND PLAN / ED COURSE  I reviewed the triage vital signs and the nursing notes.                              Differential diagnosis includes, but is not limited to, gastroenteritis, possibly viral, evaluate for bacterial infections and stool culture if able though the patient reports no diarrhea for the last 2 days but still passing small amounts of gas, also concern for intra-abdominal infection especially left lower quadrant type pathology including diverticular type disease, bowel obstruction, etc.  Differential diagnosis remains broad.  Awaiting electrolyte panel and discussed with patient who is agreeable with proceeding with CT imaging  Based on his previous evaluation I think the likelihood of this being driven by acute diabetes or DKA is low, but I am currently awaiting our  metabolic panel from today    Patient's presentation is most consistent with acute complicated illness / injury requiring diagnostic workup.  The patient is on the cardiac monitor to evaluate for evidence of arrhythmia and/or significant heart rate changes.  ----------------------------------------- 10:21 AM on 09/04/2022 ----------------------------------------- Patient was feeling somewhat improved, but is now with dry heaving once again, this occurred after only taking a small amount of juice and crackers.  Based on the intractable nature of his symptoms, this is his second ER visit, a concern for mild diverticulitis but also underlying risk factors such as age and diabetes I discussed with the patient and we will admit him for further care and treatment.  Additional Zofran ordered at this time.  Initial dose of Zofran was quite helpful, but now after eating it is clear that he is not able to tolerate by mouth at the present time.    Consulted with our hospitalist Dr. Blaine Hamper, Who is agreeable with plan for admission     FINAL CLINICAL IMPRESSION(S) / ED DIAGNOSES   Final diagnoses:  Diverticulitis of  colon  Adrenal nodule ([place])     Rx / DC Orders   ED Discharge Orders          Ordered    amoxicillin-clavulanate (AUGMENTIN) 875-125 MG tablet  2 times daily        09/04/22 8101             Note:  This document was prepared using Dragon voice recognition software and may include unintentional dictation errors.   Delman Kitten, MD 09/04/22 1044

## 2022-09-04 NOTE — Assessment & Plan Note (Addendum)
CT showed a small LEFT adrenal nodule, cannot be fully characterize.  - will need to f/u with PCP as outpt work up

## 2022-09-05 DIAGNOSIS — G4733 Obstructive sleep apnea (adult) (pediatric): Secondary | ICD-10-CM | POA: Diagnosis present

## 2022-09-05 DIAGNOSIS — Z9049 Acquired absence of other specified parts of digestive tract: Secondary | ICD-10-CM | POA: Diagnosis not present

## 2022-09-05 DIAGNOSIS — Z794 Long term (current) use of insulin: Secondary | ICD-10-CM | POA: Diagnosis not present

## 2022-09-05 DIAGNOSIS — E278 Other specified disorders of adrenal gland: Secondary | ICD-10-CM | POA: Diagnosis present

## 2022-09-05 DIAGNOSIS — E785 Hyperlipidemia, unspecified: Secondary | ICD-10-CM | POA: Diagnosis present

## 2022-09-05 DIAGNOSIS — E119 Type 2 diabetes mellitus without complications: Secondary | ICD-10-CM | POA: Diagnosis present

## 2022-09-05 DIAGNOSIS — Z6835 Body mass index (BMI) 35.0-35.9, adult: Secondary | ICD-10-CM | POA: Diagnosis not present

## 2022-09-05 DIAGNOSIS — F319 Bipolar disorder, unspecified: Secondary | ICD-10-CM | POA: Diagnosis present

## 2022-09-05 DIAGNOSIS — Z7982 Long term (current) use of aspirin: Secondary | ICD-10-CM | POA: Diagnosis not present

## 2022-09-05 DIAGNOSIS — K219 Gastro-esophageal reflux disease without esophagitis: Secondary | ICD-10-CM | POA: Diagnosis present

## 2022-09-05 DIAGNOSIS — K5732 Diverticulitis of large intestine without perforation or abscess without bleeding: Secondary | ICD-10-CM | POA: Diagnosis present

## 2022-09-05 DIAGNOSIS — K5792 Diverticulitis of intestine, part unspecified, without perforation or abscess without bleeding: Secondary | ICD-10-CM | POA: Diagnosis not present

## 2022-09-05 DIAGNOSIS — E669 Obesity, unspecified: Secondary | ICD-10-CM | POA: Diagnosis present

## 2022-09-05 DIAGNOSIS — Z79899 Other long term (current) drug therapy: Secondary | ICD-10-CM | POA: Diagnosis not present

## 2022-09-05 DIAGNOSIS — Z79890 Hormone replacement therapy (postmenopausal): Secondary | ICD-10-CM | POA: Diagnosis not present

## 2022-09-05 DIAGNOSIS — E876 Hypokalemia: Secondary | ICD-10-CM | POA: Diagnosis present

## 2022-09-05 DIAGNOSIS — E039 Hypothyroidism, unspecified: Secondary | ICD-10-CM | POA: Diagnosis present

## 2022-09-05 DIAGNOSIS — I10 Essential (primary) hypertension: Secondary | ICD-10-CM | POA: Diagnosis present

## 2022-09-05 DIAGNOSIS — E860 Dehydration: Secondary | ICD-10-CM | POA: Diagnosis present

## 2022-09-05 LAB — CBC
HCT: 38.2 % — ABNORMAL LOW (ref 39.0–52.0)
Hemoglobin: 12.8 g/dL — ABNORMAL LOW (ref 13.0–17.0)
MCH: 32.8 pg (ref 26.0–34.0)
MCHC: 33.5 g/dL (ref 30.0–36.0)
MCV: 97.9 fL (ref 80.0–100.0)
Platelets: 198 10*3/uL (ref 150–400)
RBC: 3.9 MIL/uL — ABNORMAL LOW (ref 4.22–5.81)
RDW: 13.6 % (ref 11.5–15.5)
WBC: 7.5 10*3/uL (ref 4.0–10.5)
nRBC: 0 % (ref 0.0–0.2)

## 2022-09-05 LAB — BASIC METABOLIC PANEL
Anion gap: 7 (ref 5–15)
BUN: 13 mg/dL (ref 8–23)
CO2: 24 mmol/L (ref 22–32)
Calcium: 8.4 mg/dL — ABNORMAL LOW (ref 8.9–10.3)
Chloride: 111 mmol/L (ref 98–111)
Creatinine, Ser: 0.81 mg/dL (ref 0.61–1.24)
GFR, Estimated: >60 mL/min (ref >60)
Glucose, Bld: 104 mg/dL — ABNORMAL HIGH (ref 70–99)
Potassium: 3.3 mmol/L — ABNORMAL LOW (ref 3.5–5.1)
Sodium: 142 mmol/L (ref 135–145)

## 2022-09-05 LAB — GASTROINTESTINAL PANEL BY PCR, STOOL (REPLACES STOOL CULTURE)

## 2022-09-05 LAB — GLUCOSE, CAPILLARY
Glucose-Capillary: 94 mg/dL (ref 70–99)
Glucose-Capillary: 142 mg/dL — ABNORMAL HIGH (ref 70–99)
Glucose-Capillary: 99 mg/dL (ref 70–99)
Glucose-Capillary: 99 mg/dL (ref 70–99)

## 2022-09-05 MED ORDER — POTASSIUM CHLORIDE 10 MEQ/100ML IV SOLN
10.0000 meq | INTRAVENOUS | Status: AC
  Administered 2022-09-05 (×5): 10 meq via INTRAVENOUS
  Filled 2022-09-05 (×5): qty 100

## 2022-09-05 MED ORDER — SODIUM CHLORIDE 0.9 % IV SOLN: INTRAVENOUS | Status: DC

## 2022-09-05 NOTE — Progress Notes (Signed)
  Chaplain On-Call responded to [place] Order from Trena Platt, RN, for prayer with the patient.  Chaplain offered supportive listening as the patient described his recent health challenges that have meant several hospital admissions.  The patient stated that his faith community is the [employer] in [place]. He receives many phone calls and visits from church members, which lift his spirit.  Chaplain provided spiritual and emotional support and prayer.  Chaplain Pollyann Samples M.Div., [facility]

## 2022-09-05 NOTE — Progress Notes (Addendum)
PROGRESS NOTE    John David  ZJQ:[id number] DOB: 1951-09-03 DOA: 09/04/2022 PCP: Renee Rival, NP   Brief Narrative: 71 year old male lives at home alone admitted with nausea vomiting diarrhea and abdominal pain.  Patient came to the ER on 09/02/2022 and was discharged on Zofran.  He became back 24 hours later with persistent symptoms and was admitted with a diagnosis of mild diverticulitis.  She is admitted for IV fluids and IV antibiotics.  CT of the abdomen and pelvis showed mild diverticulitis of the descending colon.  Small left adrenal nodule needs outpatient follow-up.  Assessment & Plan:   Principal Problem:   Acute diverticulitis Active Problems:   Diabetes mellitus without complication (HCC)   Hyperlipidemia   Hypertension   Hypothyroidism   Depression   Bipolar disorder ([place])   Obesity with body mass index (BMI) of 30.0 to 39.9   Abnormal LFTs   Hypokalemia   Adrenal nodule (HCC)  #1 diverticulitis of the descending colon -patient admitted with nausea vomiting and diarrhea and abdominal pain.  He failed outpatient symptomatic management.   Patient has been n.p.o. overnight.  He still continues to have nausea but has had no vomiting or diarrhea. start him on clear liquids today.  Advance as tolerated. Continue Rocephin and Flagyl and IV fluids till he is able to take p.o. adequately. Abdominal pain reported to be better. C. difficile negative Follow-up blood culture  #2 incidental adrenal nodule needs outpatient follow-up MRI.  #3 history of hypertension-blood pressure has been running soft at 93/49 with a MAP of 61.  Continue normal saline.  He appears dry. Continue to hold HCTZ and Vasotec  #4 type 2 diabetes continue ssi CBG (last 3)  Recent Labs    09/04/22 1735 09/04/22 2051 09/05/22 0750  GLUCAP 133* 132* 94    #5 hypothyroidism continue Synthroid  #6 hyperlipidemia on statin  #7 GERD on PPI  #8 depression/bipolar-on high dose of  Seroquel 800 mg at bedtime, Zoloft 100 mg daily, Restoril 30 mg nightly  #9 obesity with BMI of 24.09 complicates overall prognosis and management   #10 hypokalemia repleted IV since he is still nauseous  Normal mag level Recheck labs in a.m.  Estimated body mass index is 35.15 kg/m as calculated from the following:   Height as of this encounter: '5\' 10"'$  (1.778 m).   Weight as of this encounter: 111.1 kg.  DVT prophylaxis: Lovenox  code Status: Full code  family Communication: None at bedside Disposition Plan:  Status is: Observation The patient remains OBS appropriate and will d/c before 2 midnights.   Consultants:  None  Procedures: None Antimicrobials: Rocephin and Flagyl  Subjective: Patient resting in bed little drowsy today did not sleep well overnight still nauseous has been n.p.o.  Objective: Vitals:   09/04/22 1000 09/04/22 1135 09/04/22 1731 09/05/22 0422  BP:  (!) 145/68 135/70 117/65  Pulse: 70 71 (!) 57 62  Resp: (!) '22 17 16 16  '$ Temp:  97.9 F (36.6 C) 97.6 F (36.4 C) 97.6 F (36.4 C)  TempSrc:      SpO2: 98% 98% 97% 94%  Weight:      Height:        Intake/Output Summary (Last 24 hours) at 09/05/2022 1045 Last data filed at 09/05/2022 0532 Gross per 24 hour  Intake 953.95 ml  Output 1860 ml  Net -906.05 ml   Filed Weights   09/04/22 0621  Weight: 111.1 kg    Examination:  General exam:  Appears in no acute distress Respiratory system: Clear to auscultation. Respiratory effort normal. Cardiovascular system: S1 & S2 heard, RRR. No JVD, murmurs, rubs, gallops or clicks. No pedal edema. Gastrointestinal system: Abdomen is distended, soft and left lower quadrant tender. No organomegaly or masses felt. Normal bowel sounds heard. Central nervous system: Alert and oriented. No focal neurological deficits. Extremities: Symmetric 5 x 5 power. Skin: No rashes, lesions or ulcers Psychiatry: Judgement and insight appear normal. Mood & affect appropriate.      Data Reviewed: I have personally reviewed following labs and imaging studies  CBC: Recent Labs  Lab 09/02/22 1348 09/04/22 0647 09/05/22 0415  WBC 8.8 8.8 7.5  NEUTROABS 5.9 6.0  --   HGB 15.0 13.0 12.8*  HCT 43.9 38.0* 38.2*  MCV 95.6 96.0 97.9  PLT 311 235 270   Basic Metabolic Panel: Recent Labs  Lab 09/02/22 1348 09/04/22 0647 09/05/22 0415  NA 136 136 142  K 4.0 3.4* 3.3*  CL 102 105 111  CO2 21* 22 24  GLUCOSE 211* 146* 104*  BUN '21 18 13  '$ CREATININE 0.98 0.85 0.81  CALCIUM 9.8 8.8* 8.4*  MG  --  2.2  --    GFR: Estimated Creatinine Clearance: 104.4 mL/min (by C-G formula based on SCr of 0.81 mg/dL). Liver Function Tests: Recent Labs  Lab 09/02/22 1348 09/04/22 0647  AST 68* 63*  ALT 63* 56*  ALKPHOS 61 54  BILITOT 1.1 0.6  PROT 8.0 6.6  ALBUMIN 4.8 3.9   Recent Labs  Lab 09/04/22 0647  LIPASE 36   No results for input(s): "AMMONIA" in the last 168 hours. Coagulation Profile: No results for input(s): "INR", "PROTIME" in the last 168 hours. Cardiac Enzymes: No results for input(s): "CKTOTAL", "CKMB", "CKMBINDEX", "TROPONINI" in the last 168 hours. BNP (last 3 results) No results for input(s): "PROBNP" in the last 8760 hours. HbA1C: Recent Labs    09/04/22 0647  HGBA1C 6.3*   CBG: Recent Labs  Lab 09/02/22 1339 09/04/22 1223 09/04/22 1735 09/04/22 2051 09/05/22 0750  GLUCAP 205* 131* 133* 132* 94   Lipid Profile: No results for input(s): "CHOL", "HDL", "LDLCALC", "TRIG", "CHOLHDL", "LDLDIRECT" in the last 72 hours. Thyroid Function Tests: No results for input(s): "TSH", "T4TOTAL", "FREET4", "T3FREE", "THYROIDAB" in the last 72 hours. Anemia Panel: No results for input(s): "VITAMINB12", "FOLATE", "FERRITIN", "TIBC", "IRON", "RETICCTPCT" in the last 72 hours. Sepsis Labs: No results for input(s): "PROCALCITON", "LATICACIDVEN" in the last 168 hours.  Recent Results (from the past 240 hour(s))  C Difficile Quick Screen w PCR  reflex     Status: None   Collection Time: 09/04/22  7:42 AM   Specimen: STOOL  Result Value Ref Range Status   C Diff antigen NEGATIVE NEGATIVE Final   C Diff toxin NEGATIVE NEGATIVE Final   C Diff interpretation No C. difficile detected.  Final    Comment: Performed at [facility], [address] [place], [place] [address]  Culture, blood (Routine X 2) w Reflex to ID Panel     Status: None (Preliminary result)   Collection Time: 09/04/22  7:17 PM   Specimen: BLOOD LEFT HAND  Result Value Ref Range Status   Specimen Description BLOOD LEFT HAND  Final   Special Requests   Final    BOTTLES DRAWN AEROBIC AND ANAEROBIC Blood Culture adequate volume   Culture   Final    NO GROWTH < 12 HOURS Performed at [facility], [place], [place]  [address]    Report Status PENDING  Incomplete  Culture, blood (Routine X 2) w Reflex to ID Panel     Status: None (Preliminary result)   Collection Time: 09/04/22  7:18 PM   Specimen: BLOOD RIGHT HAND  Result Value Ref Range Status   Specimen Description BLOOD RIGHT HAND  Final   Special Requests   Final    BOTTLES DRAWN AEROBIC AND ANAEROBIC Blood Culture adequate volume   Culture   Final    NO GROWTH < 12 HOURS Performed at [facility], [address], [place], [place] [address]    Report Status PENDING  Incomplete         Radiology Studies: CT ABDOMEN PELVIS W CONTRAST  Result Date: 09/04/2022 CLINICAL DATA:  LEFT lower quadrant abdominal pain EXAM: CT ABDOMEN AND PELVIS WITH CONTRAST TECHNIQUE: Multidetector CT imaging of the abdomen and pelvis was performed using the standard protocol following bolus administration of intravenous contrast. RADIATION DOSE REDUCTION: This exam was performed according to the departmental dose-optimization program which includes automated exposure control, adjustment of the mA and/or kV according to patient size and/or use of iterative reconstruction technique.  CONTRAST:  157m OMNIPAQUE IOHEXOL 300 MG/ML  SOLN COMPARISON:  None Available. FINDINGS: Lower chest: Lung bases are clear. Hepatobiliary: No focal hepatic lesion. Postcholecystectomy. No biliary dilatation. Pancreas: Pancreas is normal. No ductal dilatation. No pancreatic inflammation. Spleen: Normal spleen Adrenals/urinary tract: 13 mm rounded enhancing nodule of the LEFT adrenal gland cannot be fully characterized on this CT protocol. Nonenhancing cyst of the RIGHT renal cortex measures 8 mm. Ureters and bladder normal. Stomach/Bowel: Stomach, small bowel, appendix, and cecum are normal. Several diverticula of the descending colon. There is minimal thickening of the peritoneal reflection along the anti mesenteric border of the descending colon. For example image 57/2 and image 59/5. .Marland KitchenVascular/Lymphatic: Abdominal aorta is normal caliber. No periportal or retroperitoneal adenopathy. No pelvic adenopathy. Reproductive: Prostate unremarkable. Other: No free fluid. Musculoskeletal: No aggressive osseous lesion. IMPRESSION: 1. Potential very mild diverticulitis of the descending colon. Subtle findings. 2. Small LEFT adrenal nodule cannot be fully characterize. Consider MRI adrenal protocol for further characterization. Electronically Signed   By: SSuzy BouchardM.D.   On: 09/04/2022 08:57        Scheduled Meds:  atorvastatin  40 mg Oral Daily   enalapril  20 mg Oral Daily   enoxaparin (LOVENOX) injection  0.5 mg/kg Subcutaneous Q24H   famotidine  20 mg Oral BID   gabapentin  300 mg Oral QHS   insulin aspart  0-5 Units Subcutaneous QHS   insulin aspart  0-9 Units Subcutaneous TID WC   insulin glargine-yfgn  25 Units Subcutaneous QHS   levothyroxine  25 mcg Oral QAC breakfast   QUEtiapine  800 mg Oral QHS   sertraline  100 mg Oral Daily   Continuous Infusions:  sodium chloride 75 mL/hr at 09/05/22 0752   cefTRIAXone (ROCEPHIN)  IV 2 g (09/04/22 2023)   metronidazole 500 mg (09/04/22 2106)    promethazine (PHENERGAN) injection (IM or IVPB)       LOS: 0 days    Time spent: 39 minutes EGeorgette Shell MD 09/05/2022, 10:45 AM

## 2022-09-06 DIAGNOSIS — K5792 Diverticulitis of intestine, part unspecified, without perforation or abscess without bleeding: Secondary | ICD-10-CM | POA: Diagnosis not present

## 2022-09-06 LAB — CBC
HCT: 36.8 % — ABNORMAL LOW (ref 39.0–52.0)
Hemoglobin: 12.1 g/dL — ABNORMAL LOW (ref 13.0–17.0)
MCH: 32.3 pg (ref 26.0–34.0)
MCHC: 32.9 g/dL (ref 30.0–36.0)
MCV: 98.1 fL (ref 80.0–100.0)
Platelets: 178 10*3/uL (ref 150–400)
RBC: 3.75 MIL/uL — ABNORMAL LOW (ref 4.22–5.81)
RDW: 13.6 % (ref 11.5–15.5)
WBC: 6.5 10*3/uL (ref 4.0–10.5)
nRBC: 0 % (ref 0.0–0.2)

## 2022-09-06 LAB — COMPREHENSIVE METABOLIC PANEL
ALT: 55 U/L — ABNORMAL HIGH (ref 0–44)
AST: 41 U/L (ref 15–41)
Albumin: 3.4 g/dL — ABNORMAL LOW (ref 3.5–5.0)
Alkaline Phosphatase: 48 U/L (ref 38–126)
Anion gap: 6 (ref 5–15)
BUN: 13 mg/dL (ref 8–23)
CO2: 24 mmol/L (ref 22–32)
Calcium: 7.9 mg/dL — ABNORMAL LOW (ref 8.9–10.3)
Chloride: 113 mmol/L — ABNORMAL HIGH (ref 98–111)
Creatinine, Ser: 0.86 mg/dL (ref 0.61–1.24)
GFR, Estimated: >60 mL/min (ref >60)
Glucose, Bld: 88 mg/dL (ref 70–99)
Potassium: 3.5 mmol/L (ref 3.5–5.1)
Sodium: 143 mmol/L (ref 135–145)
Total Bilirubin: 0.7 mg/dL (ref 0.3–1.2)
Total Protein: 6 g/dL — ABNORMAL LOW (ref 6.5–8.1)

## 2022-09-06 LAB — GLUCOSE, CAPILLARY
Glucose-Capillary: 90 mg/dL (ref 70–99)
Glucose-Capillary: 98 mg/dL (ref 70–99)
Glucose-Capillary: 103 mg/dL — ABNORMAL HIGH (ref 70–99)
Glucose-Capillary: 106 mg/dL — ABNORMAL HIGH (ref 70–99)

## 2022-09-06 LAB — MAGNESIUM: Magnesium: 2.1 mg/dL (ref 1.7–2.4)

## 2022-09-06 MED ORDER — DIPHENOXYLATE-ATROPINE 2.5-0.025 MG PO TABS
1.0000 | ORAL_TABLET | Freq: Three times a day (TID) | ORAL | Status: AC
  Administered 2022-09-06 (×3): 1 via ORAL
  Filled 2022-09-06 (×3): qty 1

## 2022-09-06 NOTE — Plan of Care (Signed)

## 2022-09-06 NOTE — Hospital Course (Addendum)
70 year old male with medical history significant of hypertension, hyperlipidemia, diabetes mellitus, GERD, hypothyroidism, depression, bipolar, OSA, obesity obesity BMI 35.15, lives at home alone presented with nausea vomiting diarrhea and abdominal pain - diverticulitis. 09/06: Patient came to the ER for above complaints and was discharged on Zofran.   09/07: returned to ED with persistent symptoms  09/08: He was admitted 09/04/22 with a diagnosis of mild diverticulitis. Treated w/ IV fluids and IV antibiotics.  CT of the abdomen and pelvis showed mild diverticulitis of the descending colon. Small left adrenal nodule needs outpatient follow-up w/ MRI. 09/09: continued on IV fluids, still having diarrhea 09/10: Still loose stool and frequent. Pt c/o severe weakness when ambulating to bathroom. He feels unsafe for discharge. PT eval ordered.  09/11: PT eval pt is declining SNF, TOC to arrange for [facility] and pt amenable to d/c tomorrow once he has a ride  09/12: VSS.    Consultants:  none  Procedures: none      ASSESSMENT & PLAN:   Principal Problem:   Acute diverticulitis Active Problems:   Diabetes mellitus without complication ([place])   Hyperlipidemia   Hypertension   Hypothyroidism   Depression   Bipolar disorder ([place])   Obesity with body mass index (BMI) of 30.0 to 39.9   Abnormal LFTs   Hypokalemia   Adrenal nodule (HCC)   Acute diverticulitis Generalized weakness associated w/ acute illness  CT scan showed possible mild diverticulitis.   Patient as clinically dehydrated.  Received IV fluid --> advanced diet as tolerated. IV Rocephin and Flagyl (patient received 1 dose of oral Augmentin in ED) As needed Zofran and morphine Negative C. difficile and GI pathogen panel BCx NG x2d Continues to have mild diarrhea but much improved   Diabetes mellitus without complication ([place]) Patient is taking metformin, Humalog, Levemir 40 units daily at home A1C 6.3, good control   Sliding scale insulin Glargine insulin 25 units daily  Hyperlipidemia Lipitor  Hypertension IV hydralazine as needed Enalapril Hold HCTZ given dehydration  Hypothyroidism Synthroid  Depression Bipolar disorder ([place]) Continue home medications: Klonopin, Seroquel, Zoloft  Obesity with body mass index (BMI) of 30.0 to 39.9 BMI= 35.15   and BW= 111.1 Diet and exercise.   Encourage to lose weight.  Abnormal LFTs Patient has mild abnormal liver function, likely due to ongoing infection. Avoid using Tylenol Follow outpatient   Hypokalemia  Potassium 3.4 Repleted potassium Check magnesium level --> 2.2 Follow periodic BMP Potassium supplementation for few days outpatient and follow w/ PCP  Adrenal nodule ([place]) CT showed a small LEFT adrenal nodule, cannot be fully characterize.  will need to f/u with PCP as outpt work up     DVT prophylaxis: Lovenox Pertinent IV fluids/nutrition: IV fluids continuous for dehdration --> po advance diet as tolerated  Central lines / invasive devices: none  Code Status: FULL Family Communication: none at this time   Disposition: hopefully stable for discharge soon pending PT eval   TOC needs: pending PT eval may need SNF/HH Barriers to discharge / significant pending items: awaiting arrange [facility]

## 2022-09-06 NOTE — Progress Notes (Signed)
PROGRESS NOTE    John David   IOM:[id number] DOB: 1951/10/05  DOA: 09/04/2022 Date of Service: 09/06/22 PCP: Renee Rival, NP     Brief Narrative / Hospital Course:  71 year old male with medical history significant of hypertension, hyperlipidemia, diabetes mellitus, GERD, hypothyroidism, depression, bipolar, OSA, obesity obesity BMI 35.15, lives at home alone presented with nausea vomiting diarrhea and abdominal pain - diverticulitis   Patient came to the ER on 09/02/2022 and was discharged on Zofran.  He returned day later with persistent symptoms and was admitted 09/04/22 with a diagnosis of mild diverticulitis. Treated w/ IV fluids and IV antibiotics.  CT of the abdomen and pelvis showed mild diverticulitis of the descending colon. Small left adrenal nodule needs outpatient follow-up w/ MRI.  09/10 Pt c/o severe weakness when ambulating to bathroom. He feels unsafe for discharge. PT eval ordered.    Consultants:  none  Procedures: none      ASSESSMENT & PLAN:   Principal Problem:   Acute diverticulitis Active Problems:   Diabetes mellitus without complication ([place])   Hyperlipidemia   Hypertension   Hypothyroidism   Depression   Bipolar disorder ([place])   Obesity with body mass index (BMI) of 30.0 to 39.9   Abnormal LFTs   Hypokalemia   Adrenal nodule ([facility])   Acute diverticulitis Generalized weakness associated w/ acute illness  CT scan showed possible mild diverticulitis.   Patient is clinically dehydrated.  Will need IV fluid. IV Rocephin and Flagyl (patient received 1 dose of oral Augmentin in ED) As needed Zofran and morphine IV fluid: 1.5 L normal saline Negative C. difficile and GI pathogen panel BCx NG x2d  Diabetes mellitus without complication ([place]) Patient is taking metformin, Humalog, Levemir 40 units daily at home A1C 6.3, good control  Sliding scale insulin Glargine insulin 25 units  daily  Hyperlipidemia Lipitor  Hypertension IV hydralazine as needed Enalapril Hold HCTZ since patient need IV fluid  Hypothyroidism Synthroid  Depression Bipolar disorder (HCC) Depression and bipolar: Continue home medications: Klonopin, Seroquel, Zoloft  Obesity with body mass index (BMI) of 30.0 to 39.9 BMI= 35.15   and BW= 111.1 Diet and exercise.   Encourage to lose weight.  Abnormal LFTs Patient has mild abnormal liver function, likely due to ongoing infection. Avoid using Tylenol  Hypokalemia Potassium 3.4 Repleted potassium Check magnesium level --> 2.2  Adrenal nodule ([place]) CT showed a small LEFT adrenal nodule, cannot be fully characterize.   will need to f/u with PCP as outpt work up      DVT prophylaxis: Lovenox Pertinent IV fluids/nutrition: IV fluids continuous for dehdration Central lines / invasive devices: none  Code Status: FULL Family Communication: none at this time   Disposition: OBS - hopefully stable for discharge today  TOC needs: none Barriers to discharge / significant pending items: clinical improvement              Subjective:  Patient reports feeling very weak today, still having diarrhea, feels he is not safe to go home since he lives alone.        Objective:  Vitals:   09/05/22 1554 09/05/22 1919 09/06/22 0447 09/06/22 0741  BP: 135/65 137/80 (!) 146/74 (!) 143/66  Pulse: (!) 58 61 73 69  Resp:  '18 18 16  '$ Temp: 98 F (36.7 C) 98.1 F (36.7 C) 97.8 F (36.6 C) 98.3 F (36.8 C)  TempSrc:  Oral Oral   SpO2: 97% 96% 93% 94%  Weight:  Height:        Intake/Output Summary (Last 24 hours) at 09/06/2022 1442 Last data filed at 09/06/2022 4098 Gross per 24 hour  Intake --  Output 750 ml  Net -750 ml   Filed Weights   09/04/22 1191  Weight: 111.1 kg    Examination:  Constitutional:  VS as above General Appearance: alert, well-developed, well-nourished, NAD Eyes: Normal lids and conjunctive,  non-icteric sclera Ears, Nose, Mouth, Throat: Normal external appearance MMM Neck: No masses, trachea midline Respiratory: Normal respiratory effort No wheeze No rhonchi No rales Cardiovascular: S1/S2 normal No murmur No rub/gallop auscultated No JVD No lower extremity edema Gastrointestinal: No tenderness No masses No hernia appreciated Musculoskeletal:  No clubbing/cyanosis of digits Symmetrical movement in all extremities Neurological: No cranial nerve deficit on limited exam Alert Psychiatric: Normal judgment/insight Normal mood and affect       Scheduled Medications:   atorvastatin  40 mg Oral Daily   diphenoxylate-atropine  1 tablet Oral TID   enoxaparin (LOVENOX) injection  0.5 mg/kg Subcutaneous Q24H   famotidine  20 mg Oral BID   gabapentin  300 mg Oral QHS   insulin aspart  0-5 Units Subcutaneous QHS   insulin aspart  0-9 Units Subcutaneous TID WC   insulin glargine-yfgn  25 Units Subcutaneous QHS   levothyroxine  25 mcg Oral QAC breakfast   QUEtiapine  800 mg Oral QHS   sertraline  100 mg Oral Daily    Continuous Infusions:  sodium chloride 125 mL/hr at 09/06/22 1004   cefTRIAXone (ROCEPHIN)  IV 2 g (09/05/22 2129)   metronidazole 500 mg (09/06/22 1006)   promethazine (PHENERGAN) injection (IM or IVPB)      PRN Medications:  clonazePAM, hydrALAZINE, ibuprofen, ondansetron (ZOFRAN) IV, promethazine (PHENERGAN) injection (IM or IVPB), temazepam, traMADol  Antimicrobials:  Anti-infectives (From admission, onward)    Start     Dose/Rate Route Frequency Ordered Stop   09/04/22 2100  cefTRIAXone (ROCEPHIN) 2 g in sodium chloride 0.9 % 100 mL IVPB        2 g 200 mL/hr over 30 Minutes Intravenous Every 24 hours 09/04/22 1043     09/04/22 1045  metroNIDAZOLE (FLAGYL) IVPB 500 mg        500 mg 100 mL/hr over 60 Minutes Intravenous Every 12 hours 09/04/22 1043     09/04/22 0915  amoxicillin-clavulanate (AUGMENTIN) 875-125 MG per tablet 1 tablet         1 tablet Oral  Once 09/04/22 0904 09/04/22 0910   09/04/22 0000  amoxicillin-clavulanate (AUGMENTIN) 875-125 MG tablet        1 tablet Oral 2 times daily 09/04/22 0927 09/14/22 2359       Data Reviewed: I have personally reviewed following labs and imaging studies  CBC: Recent Labs  Lab 09/02/22 1348 09/04/22 0647 09/05/22 0415 09/06/22 0457  WBC 8.8 8.8 7.5 6.5  NEUTROABS 5.9 6.0  --   --   HGB 15.0 13.0 12.8* 12.1*  HCT 43.9 38.0* 38.2* 36.8*  MCV 95.6 96.0 97.9 98.1  PLT 311 235 198 478   Basic Metabolic Panel: Recent Labs  Lab 09/02/22 1348 09/04/22 0647 09/05/22 0415 09/06/22 0457  NA 136 136 142 143  K 4.0 3.4* 3.3* 3.5  CL 102 105 111 113*  CO2 21* '22 24 24  '$ GLUCOSE 211* 146* 104* 88  BUN '21 18 13 13  '$ CREATININE 0.98 0.85 0.81 0.86  CALCIUM 9.8 8.8* 8.4* 7.9*  MG  --  2.2  --  2.1   GFR: Estimated Creatinine Clearance: 98.3 mL/min (by C-G formula based on SCr of 0.86 mg/dL). Liver Function Tests: Recent Labs  Lab 09/02/22 1348 09/04/22 0647 09/06/22 0457  AST 68* 63* 41  ALT 63* 56* 55*  ALKPHOS 61 54 48  BILITOT 1.1 0.6 0.7  PROT 8.0 6.6 6.0*  ALBUMIN 4.8 3.9 3.4*   Recent Labs  Lab 09/04/22 0647  LIPASE 36   No results for input(s): "AMMONIA" in the last 168 hours. Coagulation Profile: No results for input(s): "INR", "PROTIME" in the last 168 hours. Cardiac Enzymes: No results for input(s): "CKTOTAL", "CKMB", "CKMBINDEX", "TROPONINI" in the last 168 hours. BNP (last 3 results) No results for input(s): "PROBNP" in the last 8760 hours. HbA1C: Recent Labs    09/04/22 0647  HGBA1C 6.3*   CBG: Recent Labs  Lab 09/05/22 1148 09/05/22 1557 09/05/22 2121 09/06/22 0742 09/06/22 1212  GLUCAP 142* 99 99 90 98   Lipid Profile: No results for input(s): "CHOL", "HDL", "LDLCALC", "TRIG", "CHOLHDL", "LDLDIRECT" in the last 72 hours. Thyroid Function Tests: No results for input(s): "TSH", "T4TOTAL", "FREET4", "T3FREE", "THYROIDAB" in  the last 72 hours. Anemia Panel: No results for input(s): "VITAMINB12", "FOLATE", "FERRITIN", "TIBC", "IRON", "RETICCTPCT" in the last 72 hours. Urine analysis: No results found for: "COLORURINE", "APPEARANCEUR", "LABSPEC", "PHURINE", "GLUCOSEU", "HGBUR", "BILIRUBINUR", "KETONESUR", "PROTEINUR", "UROBILINOGEN", "NITRITE", "LEUKOCYTESUR" Sepsis Labs: '@LABRCNTIP'$ (procalcitonin:4,lacticidven:4)  Recent Results (from the past 240 hour(s))  C Difficile Quick Screen w PCR reflex     Status: None   Collection Time: 09/04/22  7:42 AM   Specimen: STOOL  Result Value Ref Range Status   C Diff antigen NEGATIVE NEGATIVE Final   C Diff toxin NEGATIVE NEGATIVE Final   C Diff interpretation No C. difficile detected.  Final    Comment: Performed at [facility], [address] [place], [place] [address]  Culture, blood (Routine X 2) w Reflex to ID Panel     Status: None (Preliminary result)   Collection Time: 09/04/22  7:17 PM   Specimen: BLOOD LEFT HAND  Result Value Ref Range Status   Specimen Description BLOOD LEFT HAND  Final   Special Requests   Final    BOTTLES DRAWN AEROBIC AND ANAEROBIC Blood Culture adequate volume   Culture   Final    NO GROWTH 2 DAYS Performed at [facility], [address], [place], [place] [address]    Report Status PENDING  Incomplete  Culture, blood (Routine X 2) w Reflex to ID Panel     Status: None (Preliminary result)   Collection Time: 09/04/22  7:18 PM   Specimen: BLOOD RIGHT HAND  Result Value Ref Range Status   Specimen Description BLOOD RIGHT HAND  Final   Special Requests   Final    BOTTLES DRAWN AEROBIC AND ANAEROBIC Blood Culture adequate volume   Culture   Final    NO GROWTH 2 DAYS Performed at [facility], [address] [place], [place] [address]    Report Status PENDING  Incomplete  Gastrointestinal Panel by PCR , Stool     Status: None   Collection Time: 09/04/22  9:05 PM  Result Value Ref Range Status    Campylobacter species NOT DETECTED NOT DETECTED Final   Plesimonas shigelloides NOT DETECTED NOT DETECTED Final   Salmonella species NOT DETECTED NOT DETECTED Final   Yersinia enterocolitica NOT DETECTED NOT DETECTED Final   Vibrio species NOT DETECTED NOT DETECTED Final   Vibrio cholerae NOT DETECTED NOT DETECTED  Final   Enteroaggregative E coli (EAEC) NOT DETECTED NOT DETECTED Final   Enteropathogenic E coli (EPEC) NOT DETECTED NOT DETECTED Final   Enterotoxigenic E coli (ETEC) NOT DETECTED NOT DETECTED Final   Shiga like toxin producing E coli (STEC) NOT DETECTED NOT DETECTED Final   Shigella/Enteroinvasive E coli (EIEC) NOT DETECTED NOT DETECTED Final   Cryptosporidium NOT DETECTED NOT DETECTED Final   Cyclospora cayetanensis NOT DETECTED NOT DETECTED Final   Entamoeba histolytica NOT DETECTED NOT DETECTED Final   Giardia lamblia NOT DETECTED NOT DETECTED Final   Adenovirus F40/41 NOT DETECTED NOT DETECTED Final   Astrovirus NOT DETECTED NOT DETECTED Final   Norovirus GI/GII NOT DETECTED NOT DETECTED Final   Rotavirus A NOT DETECTED NOT DETECTED Final   Sapovirus (I, II, IV, and V) NOT DETECTED NOT DETECTED Final    Comment: Performed at [facility], [address], [place],  [address]         Radiology Studies: CT ABDOMEN PELVIS W CONTRAST  Result Date: 09/04/2022 CLINICAL DATA:  LEFT lower quadrant abdominal pain EXAM: CT ABDOMEN AND PELVIS WITH CONTRAST TECHNIQUE: Multidetector CT imaging of the abdomen and pelvis was performed using the standard protocol following bolus administration of intravenous contrast. RADIATION DOSE REDUCTION: This exam was performed according to the departmental dose-optimization program which includes automated exposure control, adjustment of the mA and/or kV according to patient size and/or use of iterative reconstruction technique. CONTRAST:  176m OMNIPAQUE IOHEXOL 300 MG/ML  SOLN COMPARISON:  None Available. FINDINGS: Lower chest:  Lung bases are clear. Hepatobiliary: No focal hepatic lesion. Postcholecystectomy. No biliary dilatation. Pancreas: Pancreas is normal. No ductal dilatation. No pancreatic inflammation. Spleen: Normal spleen Adrenals/urinary tract: 13 mm rounded enhancing nodule of the LEFT adrenal gland cannot be fully characterized on this CT protocol. Nonenhancing cyst of the RIGHT renal cortex measures 8 mm. Ureters and bladder normal. Stomach/Bowel: Stomach, small bowel, appendix, and cecum are normal. Several diverticula of the descending colon. There is minimal thickening of the peritoneal reflection along the anti mesenteric border of the descending colon. For example image 57/2 and image 59/5. .Marland KitchenVascular/Lymphatic: Abdominal aorta is normal caliber. No periportal or retroperitoneal adenopathy. No pelvic adenopathy. Reproductive: Prostate unremarkable. Other: No free fluid. Musculoskeletal: No aggressive osseous lesion. IMPRESSION: 1. Potential very mild diverticulitis of the descending colon. Subtle findings. 2. Small LEFT adrenal nodule cannot be fully characterize. Consider MRI adrenal protocol for further characterization. Electronically Signed   By: SSuzy BouchardM.D.   On: 09/04/2022 08:57            LOS: 1 day      NEmeterio Reeve DO [facility] 09/06/2022, 2:42 PM   Staff may message me via secure chat in [place] but this may not receive immediate response,  please page for urgent matters!  If 7PM-7AM, please contact night-coverage [email or web address]  Dictation software was used to generate the above note. Typos may occur and escape review, as with typed/written notes. Please contact Dr ASheppard Coildirectly for clarity if needed.

## 2022-09-07 DIAGNOSIS — K5792 Diverticulitis of intestine, part unspecified, without perforation or abscess without bleeding: Secondary | ICD-10-CM | POA: Diagnosis not present

## 2022-09-07 LAB — GLUCOSE, CAPILLARY
Glucose-Capillary: 133 mg/dL — ABNORMAL HIGH (ref 70–99)
Glucose-Capillary: 135 mg/dL — ABNORMAL HIGH (ref 70–99)
Glucose-Capillary: 112 mg/dL — ABNORMAL HIGH (ref 70–99)
Glucose-Capillary: 95 mg/dL (ref 70–99)

## 2022-09-07 MED ORDER — ENALAPRIL MALEATE 20 MG PO TABS
20.0000 mg | ORAL_TABLET | Freq: Every day | ORAL | Status: DC
  Administered 2022-09-07 – 2022-09-09 (×3): 20 mg via ORAL
  Filled 2022-09-07 (×3): qty 1

## 2022-09-07 MED ORDER — LOPERAMIDE HCL 2 MG PO CAPS
4.0000 mg | ORAL_CAPSULE | ORAL | Status: DC | PRN
  Administered 2022-09-07: 4 mg via ORAL
  Filled 2022-09-07: qty 2

## 2022-09-07 NOTE — Evaluation (Addendum)
Physical Therapy Evaluation Patient Details Name: John David MRN: [id number] DOB: Dec 31, 1950 Today's Date: 09/07/2022  History of Present Illness  Pt is a 71 y/o M admitted on 09/04/22 after presenting with c/o N&V, diarrhea & abdominal pain. Pt is being treated for acute diverticulitis. PMH: HTN, HLD, Dm, GERD, hypothyroidism, depression, bipolar, OSA, obesity  Clinical Impression  Pt seen for PT evaluation with pt pleasant & agreeable to tx. Pt reports prior to admission he was independent, cooking, cleaning, driving, chopping wood & living with his support dog. On this date, pt demonstrates 3-/5 knee extension BLE & pt appears to have good understanding of CLOF & need for assistance with mobility. PT provides education re: scooting to edge of seat & pushing to stand & pt able to do so with min assist. Pt ambulates in room with RW & min assist, mod assist on 1 occasion 2/2 LOB. Pt demonstrates significant BLE weakness & requires support from RW. Pt would benefit from ongoing PT services to increase strength, balance & endurance to help facilitate return to independent PLOF.    Recommendations for follow up therapy are one component of a multi-disciplinary discharge planning process, led by the attending physician.  Recommendations may be updated based on patient status, additional functional criteria and insurance authorization.  Follow Up Recommendations Skilled nursing facility - addendum: updated to SNF from CIR as CIR has declined pt. Pt would benefit from ongoing therapy in post acute setting to maximize independence with mobility & reduce fall risk.    Assistance Recommended at Discharge Intermittent Supervision/Assistance  Patient can return home with the following  A little help with walking and/or transfers;A little help with bathing/dressing/bathroom;Assistance with cooking/housework;Assist for transportation;Help with stairs or ramp for entrance    Equipment Recommendations  Rolling walker (2 wheels)  Recommendations for Other Services  OT consult    Functional Status Assessment Patient has had a recent decline in their functional status and demonstrates the ability to make significant improvements in function in a reasonable and predictable amount of time.     Precautions / Restrictions Precautions Precautions: Fall Restrictions Weight Bearing Restrictions: No      Mobility  Bed Mobility               General bed mobility comments: not observed, pt received & left sitting in recliner    Transfers Overall transfer level: Needs assistance Equipment used: Rolling walker (2 wheels) Transfers: Sit to/from Stand Sit to Stand: Min assist           General transfer comment: cuing to scoot out to edge of seat, BUE hands pushing to stand on chair armrests, pt demonstrates slight posterior lean on recliner with BLE    Ambulation/Gait Ambulation/Gait assistance: Min assist, Mod assist Gait Distance (Feet): 45 Feet Assistive device: Rolling walker (2 wheels) Gait Pattern/deviations: Decreased step length - right, Decreased step length - left, Decreased stride length Gait velocity: decreased     General Gait Details: Pt provides cuing to ambulate within base of AD vs pushing it out in front of him. Pt with 1 LOB requiring mod assist to correct.  Stairs            Wheelchair Mobility    Modified Rankin (Stroke Patients Only)       Balance Overall balance assessment: Needs assistance Sitting-balance support: Feet supported, Bilateral upper extremity supported Sitting balance-Leahy Scale: Fair     Standing balance support: Bilateral upper extremity supported, During functional activity, Reliant on assistive device  for balance Standing balance-Leahy Scale: Fair                               Pertinent Vitals/Pain Pain Assessment Pain Assessment: No/denies pain    Home Living Family/patient expects to be discharged  to:: Private residence Living Arrangements: Alone Available Help at Discharge: Family;Friend(s);Available PRN/intermittently Type of Home: House Home Access: Stairs to enter Entrance Stairs-Rails: None Entrance Stairs-Number of Steps: 2+2 Alternate Level Stairs-Number of Steps: 2 steps without rails to bedroom Home Layout: Multi-level Home Equipment: None      Prior Function Prior Level of Function : Independent/Modified Independent;Driving             Mobility Comments: Pt reports he was independent without mobility, chopping wood, driving, cooking, cleaning, denies falls in the past 6 months.       Hand Dominance        Extremity/Trunk Assessment   Upper Extremity Assessment Upper Extremity Assessment: Generalized weakness    Lower Extremity Assessment Lower Extremity Assessment: Generalized weakness (3-/5 knee extension in BLE)       Communication   Communication: No difficulties  Cognition Arousal/Alertness: Awake/alert Behavior During Therapy: WFL for tasks assessed/performed Overall Cognitive Status: Within Functional Limits for tasks assessed                                          General Comments      Exercises     Assessment/Plan    PT Assessment Patient needs continued PT services  PT Problem List Decreased strength;Decreased activity tolerance;Decreased balance;Decreased mobility;Decreased knowledge of use of DME       PT Treatment Interventions DME instruction;Therapeutic exercise;Gait training;Balance training;Stair training;Neuromuscular re-education;Functional mobility training;Therapeutic activities;Patient/family education    PT Goals (Current goals can be found in the Care Plan section)  Acute Rehab PT Goals Patient Stated Goal: get better, return to PLOF PT Goal Formulation: With patient Time For Goal Achievement: 09/21/22 Potential to Achieve Goals: Good    Frequency Min 2X/week     Co-evaluation                AM-PAC PT "6 Clicks" Mobility  Outcome Measure Help needed turning from your back to your side while in a flat bed without using bedrails?: None Help needed moving from lying on your back to sitting on the side of a flat bed without using bedrails?: A Little Help needed moving to and from a bed to a chair (including a wheelchair)?: A Little Help needed standing up from a chair using your arms (e.g., wheelchair or bedside chair)?: A Little Help needed to walk in hospital room?: A Little Help needed climbing 3-5 steps with a railing? : A Lot 6 Click Score: 18    End of Session Equipment Utilized During Treatment: Gait belt Activity Tolerance: Patient tolerated treatment well Patient left: in chair;with call bell/phone within reach   PT Visit Diagnosis: Muscle weakness (generalized) (M62.81);Difficulty in walking, not elsewhere classified (R26.2);Unsteadiness on feet (R26.81)    Time: 2263-3354 PT Time Calculation (min) (ACUTE ONLY): 18 min   Charges:   PT Evaluation $PT Eval Low Complexity: [place], PT, DPT 09/07/22, 3:24 PM   Waunita Schooner 09/07/2022, 11:15 AM

## 2022-09-07 NOTE — Progress Notes (Signed)
PROGRESS NOTE    John David   PRF:[id number] DOB: 16-Nov-1951  DOA: 09/04/2022 Date of Service: 09/07/22 PCP: Renee Rival, NP     Brief Narrative / Hospital Course:  71 year old male with medical history significant of hypertension, hyperlipidemia, diabetes mellitus, GERD, hypothyroidism, depression, bipolar, OSA, obesity obesity BMI 35.15, lives at home alone presented with nausea vomiting diarrhea and abdominal pain - diverticulitis. 09/06: Patient came to the ER for above complaints and was discharged on Zofran.   09/07: returned to ED with persistent symptoms  09/08: He was admitted 09/04/22 with a diagnosis of mild diverticulitis. Treated w/ IV fluids and IV antibiotics.  CT of the abdomen and pelvis showed mild diverticulitis of the descending colon. Small left adrenal nodule needs outpatient follow-up w/ MRI. 09/09: continued on IV fluids, still having diarrhea 09/10: Still loose stool and frequent. Pt c/o severe weakness when ambulating to bathroom. He feels unsafe for discharge. PT eval ordered.  09/11: PT eval pt is declining SNF, TOC to arrange for [facility] and pt amenable to d/c tomorrow once he has a ride    Consultants:  none  Procedures: none      ASSESSMENT & PLAN:   Principal Problem:   Acute diverticulitis Active Problems:   Diabetes mellitus without complication ([place])   Hyperlipidemia   Hypertension   Hypothyroidism   Depression   Bipolar disorder ([place])   Obesity with body mass index (BMI) of 30.0 to 39.9   Abnormal LFTs   Hypokalemia   Adrenal nodule ([facility])   Acute diverticulitis Generalized weakness associated w/ acute illness  CT scan showed possible mild diverticulitis.   Patient as clinically dehydrated.  Received IV fluid --> advanced diet as tolerated. IV Rocephin and Flagyl (patient received 1 dose of oral Augmentin in ED) As needed Zofran and morphine Negative C. difficile and GI pathogen panel BCx NG x2d Continues to have  mild diarrhea but much imprpved   Diabetes mellitus without complication ([place]) Patient is taking metformin, Humalog, Levemir 40 units daily at home A1C 6.3, good control  Sliding scale insulin Glargine insulin 25 units daily  Hyperlipidemia Lipitor  Hypertension IV hydralazine as needed Enalapril Hold HCTZ given dehydration  Hypothyroidism Synthroid  Depression Bipolar disorder ([place]) Continue home medications: Klonopin, Seroquel, Zoloft  Obesity with body mass index (BMI) of 30.0 to 39.9 BMI= 35.15   and BW= 111.1 Diet and exercise.   Encourage to lose weight.  Abnormal LFTs Patient has mild abnormal liver function, likely due to ongoing infection. Avoid using Tylenol  Hypokalemia - resolved Potassium 3.4 Repleted potassium Check magnesium level --> 2.2 Follow periodic BMP  Adrenal nodule ([place]) CT showed a small LEFT adrenal nodule, cannot be fully characterize.  will need to f/u with PCP as outpt work up     DVT prophylaxis: Lovenox Pertinent IV fluids/nutrition: IV fluids continuous for dehdration --> po advance diet as tolerated  Central lines / invasive devices: none  Code Status: FULL Family Communication: none at this time   Disposition: hopefully stable for discharge soon pending PT eval   TOC needs: pending PT eval may need SNF/HH Barriers to discharge / significant pending items: awaiting arrange HH             Subjective:  Patient reports feeling very weak today, but declining SNF. Reports no BM lately but in PM having some loose stool. Amenable to [facility] and will have a ride to a friends home tomorrow  Objective:  Vitals:   09/06/22 2017 09/07/22 0356 09/07/22 0829 09/07/22 1617  BP: (!) 144/69 (!) 141/66 (!) 154/62 (!) 140/66  Pulse: 61 66 84 61  Resp: 18 16 (!) 22 16  Temp: 98.8 F (37.1 C) 97.9 F (36.6 C) 97.8 F (36.6 C) 98.3 F (36.8 C)  TempSrc:      SpO2: 95% 94% 96% 97%  Weight:      Height:         Intake/Output Summary (Last 24 hours) at 09/07/2022 1621 Last data filed at 09/07/2022 1508 Gross per 24 hour  Intake 6337.57 ml  Output 1600 ml  Net 4737.57 ml   Filed Weights   09/04/22 0621  Weight: 111.1 kg    Examination:  Constitutional:  VS as above General Appearance: alert, well-developed, well-nourished, NAD Eyes: Normal lids and conjunctive, non-icteric sclera Ears, Nose, Mouth, Throat: Normal external appearance MMM Neck: No masses, trachea midline Respiratory: Normal respiratory effort No wheeze No rhonchi No rales Cardiovascular: S1/S2 normal No murmur No rub/gallop auscultated No JVD No lower extremity edema Gastrointestinal: No tenderness No masses No hernia appreciated Musculoskeletal:  No clubbing/cyanosis of digits Symmetrical movement in all extremities Neurological: No cranial nerve deficit on limited exam Alert Psychiatric: Normal judgment/insight Normal mood and affect       Scheduled Medications:   atorvastatin  40 mg Oral Daily   enalapril  20 mg Oral Daily   enoxaparin (LOVENOX) injection  0.5 mg/kg Subcutaneous Q24H   famotidine  20 mg Oral BID   gabapentin  300 mg Oral QHS   insulin aspart  0-5 Units Subcutaneous QHS   insulin aspart  0-9 Units Subcutaneous TID WC   insulin glargine-yfgn  25 Units Subcutaneous QHS   levothyroxine  25 mcg Oral QAC breakfast   QUEtiapine  800 mg Oral QHS   sertraline  100 mg Oral Daily    Continuous Infusions:  sodium chloride 125 mL/hr at 09/07/22 1554   cefTRIAXone (ROCEPHIN)  IV Stopped (09/06/22 2309)   metronidazole Stopped (09/07/22 1047)   promethazine (PHENERGAN) injection (IM or IVPB)      PRN Medications:  clonazePAM, hydrALAZINE, ibuprofen, loperamide, ondansetron (ZOFRAN) IV, promethazine (PHENERGAN) injection (IM or IVPB), temazepam, traMADol  Antimicrobials:  Anti-infectives (From admission, onward)    Start     Dose/Rate Route Frequency Ordered Stop    09/04/22 2100  cefTRIAXone (ROCEPHIN) 2 g in sodium chloride 0.9 % 100 mL IVPB        2 g 200 mL/hr over 30 Minutes Intravenous Every 24 hours 09/04/22 1043     09/04/22 1045  metroNIDAZOLE (FLAGYL) IVPB 500 mg        500 mg 100 mL/hr over 60 Minutes Intravenous Every 12 hours 09/04/22 1043     09/04/22 0915  amoxicillin-clavulanate (AUGMENTIN) 875-125 MG per tablet 1 tablet        1 tablet Oral  Once 09/04/22 0904 09/04/22 0910   09/04/22 0000  amoxicillin-clavulanate (AUGMENTIN) 875-125 MG tablet        1 tablet Oral 2 times daily 09/04/22 0927 09/14/22 2359       Data Reviewed: I have personally reviewed following labs and imaging studies  CBC: Recent Labs  Lab 09/02/22 1348 09/04/22 0647 09/05/22 0415 09/06/22 0457  WBC 8.8 8.8 7.5 6.5  NEUTROABS 5.9 6.0  --   --   HGB 15.0 13.0 12.8* 12.1*  HCT 43.9 38.0* 38.2* 36.8*  MCV 95.6 96.0 97.9 98.1  PLT 311 235 198 178  Basic Metabolic Panel: Recent Labs  Lab 09/02/22 1348 09/04/22 0647 09/05/22 0415 09/06/22 0457  NA 136 136 142 143  K 4.0 3.4* 3.3* 3.5  CL 102 105 111 113*  CO2 21* '22 24 24  '$ GLUCOSE 211* 146* 104* 88  BUN '21 18 13 13  '$ CREATININE 0.98 0.85 0.81 0.86  CALCIUM 9.8 8.8* 8.4* 7.9*  MG  --  2.2  --  2.1   GFR: Estimated Creatinine Clearance: 98.3 mL/min (by C-G formula based on SCr of 0.86 mg/dL). Liver Function Tests: Recent Labs  Lab 09/02/22 1348 09/04/22 0647 09/06/22 0457  AST 68* 63* 41  ALT 63* 56* 55*  ALKPHOS 61 54 48  BILITOT 1.1 0.6 0.7  PROT 8.0 6.6 6.0*  ALBUMIN 4.8 3.9 3.4*   Recent Labs  Lab 09/04/22 0647  LIPASE 36   No results for input(s): "AMMONIA" in the last 168 hours. Coagulation Profile: No results for input(s): "INR", "PROTIME" in the last 168 hours. Cardiac Enzymes: No results for input(s): "CKTOTAL", "CKMB", "CKMBINDEX", "TROPONINI" in the last 168 hours. BNP (last 3 results) No results for input(s): "PROBNP" in the last 8760 hours. HbA1C: No results for  input(s): "HGBA1C" in the last 72 hours.  CBG: Recent Labs  Lab 09/06/22 1610 09/06/22 2018 09/07/22 0833 09/07/22 1123 09/07/22 1617  GLUCAP 103* 106* 133* 135* 112*   Lipid Profile: No results for input(s): "CHOL", "HDL", "LDLCALC", "TRIG", "CHOLHDL", "LDLDIRECT" in the last 72 hours. Thyroid Function Tests: No results for input(s): "TSH", "T4TOTAL", "FREET4", "T3FREE", "THYROIDAB" in the last 72 hours. Anemia Panel: No results for input(s): "VITAMINB12", "FOLATE", "FERRITIN", "TIBC", "IRON", "RETICCTPCT" in the last 72 hours. Urine analysis: No results found for: "COLORURINE", "APPEARANCEUR", "LABSPEC", "PHURINE", "GLUCOSEU", "HGBUR", "BILIRUBINUR", "KETONESUR", "PROTEINUR", "UROBILINOGEN", "NITRITE", "LEUKOCYTESUR" Sepsis Labs: '@LABRCNTIP'$ (procalcitonin:4,lacticidven:4)  Recent Results (from the past 240 hour(s))  C Difficile Quick Screen w PCR reflex     Status: None   Collection Time: 09/04/22  7:42 AM   Specimen: STOOL  Result Value Ref Range Status   C Diff antigen NEGATIVE NEGATIVE Final   C Diff toxin NEGATIVE NEGATIVE Final   C Diff interpretation No C. difficile detected.  Final    Comment: Performed at [facility], [address] [place], [place] [address]  Culture, blood (Routine X 2) w Reflex to ID Panel     Status: None (Preliminary result)   Collection Time: 09/04/22  7:17 PM   Specimen: BLOOD LEFT HAND  Result Value Ref Range Status   Specimen Description BLOOD LEFT HAND  Final   Special Requests   Final    BOTTLES DRAWN AEROBIC AND ANAEROBIC Blood Culture adequate volume   Culture   Final    NO GROWTH 3 DAYS Performed at [facility], [address] [place], [place] [address]    Report Status PENDING  Incomplete  Culture, blood (Routine X 2) w Reflex to ID Panel     Status: None (Preliminary result)   Collection Time: 09/04/22  7:18 PM   Specimen: BLOOD RIGHT HAND  Result Value Ref Range Status   Specimen Description BLOOD RIGHT  HAND  Final   Special Requests   Final    BOTTLES DRAWN AEROBIC AND ANAEROBIC Blood Culture adequate volume   Culture   Final    NO GROWTH 3 DAYS Performed at [facility], [address] [place], [place] [address]    Report Status PENDING  Incomplete  Gastrointestinal Panel by PCR , Stool  Status: None   Collection Time: 09/04/22  9:05 PM  Result Value Ref Range Status   Campylobacter species NOT DETECTED NOT DETECTED Final   Plesimonas shigelloides NOT DETECTED NOT DETECTED Final   Salmonella species NOT DETECTED NOT DETECTED Final   Yersinia enterocolitica NOT DETECTED NOT DETECTED Final   Vibrio species NOT DETECTED NOT DETECTED Final   Vibrio cholerae NOT DETECTED NOT DETECTED Final   Enteroaggregative E coli (EAEC) NOT DETECTED NOT DETECTED Final   Enteropathogenic E coli (EPEC) NOT DETECTED NOT DETECTED Final   Enterotoxigenic E coli (ETEC) NOT DETECTED NOT DETECTED Final   Shiga like toxin producing E coli (STEC) NOT DETECTED NOT DETECTED Final   Shigella/Enteroinvasive E coli (EIEC) NOT DETECTED NOT DETECTED Final   Cryptosporidium NOT DETECTED NOT DETECTED Final   Cyclospora cayetanensis NOT DETECTED NOT DETECTED Final   Entamoeba histolytica NOT DETECTED NOT DETECTED Final   Giardia lamblia NOT DETECTED NOT DETECTED Final   Adenovirus F40/41 NOT DETECTED NOT DETECTED Final   Astrovirus NOT DETECTED NOT DETECTED Final   Norovirus GI/GII NOT DETECTED NOT DETECTED Final   Rotavirus A NOT DETECTED NOT DETECTED Final   Sapovirus (I, II, IV, and V) NOT DETECTED NOT DETECTED Final    Comment: Performed at [facility], [address] [place], [place] [address]         Radiology Studies: CT ABDOMEN PELVIS W CONTRAST  Result Date: 09/04/2022 CLINICAL DATA:  LEFT lower quadrant abdominal pain EXAM: CT ABDOMEN AND PELVIS WITH CONTRAST TECHNIQUE: Multidetector CT imaging of the abdomen and pelvis was performed using the standard protocol following bolus  administration of intravenous contrast. RADIATION DOSE REDUCTION: This exam was performed according to the departmental dose-optimization program which includes automated exposure control, adjustment of the mA and/or kV according to patient size and/or use of iterative reconstruction technique. CONTRAST:  163m OMNIPAQUE IOHEXOL 300 MG/ML  SOLN COMPARISON:  None Available. FINDINGS: Lower chest: Lung bases are clear. Hepatobiliary: No focal hepatic lesion. Postcholecystectomy. No biliary dilatation. Pancreas: Pancreas is normal. No ductal dilatation. No pancreatic inflammation. Spleen: Normal spleen Adrenals/urinary tract: 13 mm rounded enhancing nodule of the LEFT adrenal gland cannot be fully characterized on this CT protocol. Nonenhancing cyst of the RIGHT renal cortex measures 8 mm. Ureters and bladder normal. Stomach/Bowel: Stomach, small bowel, appendix, and cecum are normal. Several diverticula of the descending colon. There is minimal thickening of the peritoneal reflection along the anti mesenteric border of the descending colon. For example image 57/2 and image 59/5. .Marland KitchenVascular/Lymphatic: Abdominal aorta is normal caliber. No periportal or retroperitoneal adenopathy. No pelvic adenopathy. Reproductive: Prostate unremarkable. Other: No free fluid. Musculoskeletal: No aggressive osseous lesion. IMPRESSION: 1. Potential very mild diverticulitis of the descending colon. Subtle findings. 2. Small LEFT adrenal nodule cannot be fully characterize. Consider MRI adrenal protocol for further characterization. Electronically Signed   By: SSuzy BouchardM.D.   On: 09/04/2022 08:57            LOS: 2 days      NEmeterio Reeve DO [facility] 09/07/2022, 4:21 PM   Staff may message me via secure chat in [place] but this may not receive immediate response,  please page for urgent matters!  If 7PM-7AM, please contact night-coverage [email or web address]  Dictation software was used to generate the  above note. Typos may occur and escape review, as with typed/written notes. Please contact Dr ASheppard Coildirectly for clarity if needed.

## 2022-09-07 NOTE — Progress Notes (Signed)
Inpatient Rehabilitation Admissions Coordinator   Per therapy recommendations patient was screened for CIR candidacy by Danne Baxter RN MSN. Current payor trends with [facility] Medicare are unlikley to approve a CIR/AIR level rehab for this diagnosis of acute diverticulitis and lacks the medical neccesity for Hospital level rehab. I will not place a Rehab Conuslt at this time. Recommend other rehab venues to be pursued.   Danne Baxter, RN, MSN Rehab Admissions Coordinator [phone] 09/07/2022 11:34 AM

## 2022-09-07 NOTE — Evaluation (Signed)
Occupational Therapy Evaluation Patient Details Name: John David MRN: [id number] DOB: 07-27-1951 Today's Date: 09/07/2022   History of Present Illness Pt is a 71 y/o M admitted on 09/04/22 after presenting with c/o N&V, diarrhea & abdominal pain. Pt is being treated for acute diverticulitis. PMH: HTN, HLD, Dm, GERD, hypothyroidism, depression, bipolar, OSA, obesity   Clinical Impression   Patient seen for OT evaluation. Patient presenting with decreased strength, endurance, and balance impacting safety and independence in ADLs. At baseline, pt was independent in ADLs, IADLs, and functional mobility without an AD. Patient currently functioning at set up A to supervision for seated grooming tasks. Evaluation was limited this date 2/2 pt experiencing nausea after eating lunch. Patient will benefit from acute OT to increase overall independence in the areas of ADLs and functional mobility in order to safely discharge to next venue of care.      Recommendations for follow up therapy are one component of a multi-disciplinary discharge planning process, led by the attending physician.  Recommendations may be updated based on patient status, additional functional criteria and insurance authorization.   Follow Up Recommendations  Skilled nursing-short term rehab (<3 hours/day)    Assistance Recommended at Discharge Intermittent Supervision/Assistance  Patient can return home with the following A little help with walking and/or transfers;A little help with bathing/dressing/bathroom;Assistance with cooking/housework;Help with stairs or ramp for entrance;Assist for transportation    Functional Status Assessment  Patient has had a recent decline in their functional status and demonstrates the ability to make significant improvements in function in a reasonable and predictable amount of time.  Equipment Recommendations  Other (comment) (defer to next venue of care)    Recommendations for Other  Services       Precautions / Restrictions Precautions Precautions: Fall Restrictions Weight Bearing Restrictions: No      Mobility Bed Mobility               General bed mobility comments: not observed, pt received & left sitting in recliner    Transfers Overall transfer level: Needs assistance                 General transfer comment: pt deferred this date 2/2 nausea      Balance Overall balance assessment: Needs assistance Sitting-balance support: Feet supported, Bilateral upper extremity supported Sitting balance-Leahy Scale: Fair         Standing balance comment: pt deferred this date 2/2 nausea                           ADL either performed or assessed with clinical judgement   ADL Overall ADL's : Needs assistance/impaired Eating/Feeding: Set up   Grooming: Set up;Sitting;Supervision/safety           Upper Body Dressing : Minimal assistance;Sitting Upper Body Dressing Details (indicate cue type and reason): anticipate Lower Body Dressing: Minimal assistance;Moderate assistance;Sit to/from stand;Sitting/lateral leans Lower Body Dressing Details (indicate cue type and reason): anticipate Min A in sitting, Mod A in standing                     Vision Patient Visual Report: No change from baseline       Perception     Praxis      Pertinent Vitals/Pain Pain Assessment Pain Assessment: No/denies pain (nauseous)     Hand Dominance     Extremity/Trunk Assessment Upper Extremity Assessment Upper Extremity Assessment: Generalized weakness (fair B hand grip  strength, BUE strength grossly 3+/5)   Lower Extremity Assessment Lower Extremity Assessment: Generalized weakness       Communication Communication Communication: No difficulties   Cognition Arousal/Alertness: Awake/alert Behavior During Therapy: WFL for tasks assessed/performed Overall Cognitive Status: Within Functional Limits for tasks assessed                                        General Comments       Exercises     Shoulder Instructions      Home Living Family/patient expects to be discharged to:: Private residence Living Arrangements: Alone Available Help at Discharge: Family;Friend(s);Available PRN/intermittently Type of Home: House Home Access: Stairs to enter [employer] of Steps: 2+2 Entrance Stairs-Rails: None Home Layout: Multi-level Alternate Level Stairs-Number of Steps: 2 steps without rails to bedroom Alternate Level Stairs-Rails: None Bathroom Shower/Tub: Walk-in shower         Home Equipment: None   Additional Comments: DIL from [place], [place] in to stay with pt a couple weeks      Prior Functioning/Environment Prior Level of Function : Independent/Modified Independent;Driving             Mobility Comments: Pt reports he was independent without AD, chopping wood, driving, cooking, cleaning, denies falls in the past 6 months. ADLs Comments: IND with ADLs/IADLs. Retired, works in the yard.        OT Problem List: Decreased strength;Decreased knowledge of use of DME or AE;Decreased activity tolerance;Impaired balance (sitting and/or standing)      OT Treatment/Interventions: Self-care/ADL training;Therapeutic exercise;Patient/family education;Balance training;Energy conservation;Therapeutic activities;DME and/or AE instruction    OT Goals(Current goals can be found in the care plan section) Acute Rehab OT Goals Patient Stated Goal: to go home OT Goal Formulation: With patient Time For Goal Achievement: 09/21/22 Potential to Achieve Goals: Good ADL Goals Pt Will Perform Grooming: Independently;standing Pt Will Perform Upper Body Dressing: Independently;sitting Pt Will Perform Lower Body Dressing: Independently;sitting/lateral leans;sit to/from stand Pt Will Transfer to Toilet: Independently;ambulating;regular height toilet Pt Will Perform Toileting - Clothing  Manipulation and hygiene: Independently;sit to/from stand  OT Frequency: Min 2X/week    Co-evaluation              AM-PAC OT "6 Clicks" Daily Activity     Outcome Measure Help from another person eating meals?: None Help from another person taking care of personal grooming?: A Little Help from another person toileting, which includes using toliet, bedpan, or urinal?: A Lot Help from another person bathing (including washing, rinsing, drying)?: A Lot Help from another person to put on and taking off regular upper body clothing?: A Little Help from another person to put on and taking off regular lower body clothing?: A Lot 6 Click Score: 16   End of Session Nurse Communication: Mobility status  Activity Tolerance: Patient limited by fatigue;Other (comment) (nausea) Patient left: in chair;with call bell/phone within reach  OT Visit Diagnosis: Other abnormalities of gait and mobility (R26.89);Muscle weakness (generalized) (M62.81)                Time: 0932-6712 OT Time Calculation (min): 15 min Charges:  OT General Charges $OT Visit: 1 Visit OT Evaluation $OT Eval Low Complexity: 1 Low  [facility], [place] MS, OTR/L ascom [phone]  09/07/22, 3:18 PM

## 2022-09-07 NOTE — TOC Progression Note (Addendum)
Transition of Care [facility]) - Progression Note    Patient Details  Name: John David MRN: [id number] Date of Birth: [birth year]  [facility]) CM/SW Contact  Laurena Slimmer, RN Phone Number: 09/07/2022, 4:21 PM  Clinical Narrative:    Spoke with patient. He is refusing SNF. He stated he is going to stay with a friend that will pick him up tomorrow. They will pick his daughter-in-law up from the airport and she will assist him at home. He is agreeable to [facility] and does not have a preference. Referral sent and declined by [facility] [place].    4:52pm  Referral sent and accepted by Theresia Lo from [place].         Expected Discharge Plan and Services                                                 Social Determinants of Health (SDOH) Interventions    Readmission Risk Interventions     No data to display

## 2022-09-07 NOTE — NC FL2 (Signed)
[place] LEVEL OF CARE SCREENING TOOL     IDENTIFICATION  Patient Name: John David Birthdate: 04/10/1951 Sex: male Admission Date (Current Location): 09/04/2022  [facility] and [place] Number:  Engineering geologist and Address:  [facility], [address], [place], [place] [address]      Provider Number: [id number]  Attending Physician Name and Address:  Emeterio Reeve, DO  Relative Name and Phone Number:  Larene Beach (daughter) [phone]    Current Level of Care: Hospital Recommended Level of Care: [place] Prior Approval Number:    Date Approved/Denied:   PASRR Number: [id number]  Discharge Plan: SNF    Current Diagnoses: Patient Active Problem List   Diagnosis Date Noted   Acute diverticulitis 09/04/2022   Hypothyroidism 09/04/2022   Diabetes mellitus without complication ([place])    Depression    Hyperlipidemia    Bipolar disorder ([place])    Obesity with body mass index (BMI) of 30.0 to 39.9    Hypokalemia    Abnormal LFTs    Adrenal nodule (HCC)    Hypertension     Orientation RESPIRATION BLADDER Height & Weight     Self, Time, Situation, Place  Normal Continent, External catheter Weight: 245 lb (111.1 kg) Height:  '5\' 10"'$  (177.8 cm)  BEHAVIORAL SYMPTOMS/MOOD NEUROLOGICAL BOWEL NUTRITION STATUS      Continent Diet (see discharge summary)  AMBULATORY STATUS COMMUNICATION OF NEEDS Skin   Limited Assist Verbally Normal                       Personal Care Assistance Level of Assistance  Bathing, Dressing, Total care, Feeding Bathing Assistance: Limited assistance Feeding assistance: Independent Dressing Assistance: Limited assistance Total Care Assistance: Limited assistance   Functional Limitations Info  Sight, Speech, Hearing Sight Info: Adequate Hearing Info: Adequate Speech Info: Adequate    SPECIAL CARE FACTORS FREQUENCY  PT (By licensed PT), OT (By licensed OT)     PT  Frequency: min 4x weekly OT Frequency: min 4x weekly            Contractures Contractures Info: Not present    Additional Factors Info  Code Status, Allergies Code Status Info: full Allergies Info: no known allergies           Current Medications (09/07/2022):  This is the current hospital active medication list Current Facility-Administered Medications  Medication Dose Route Frequency Provider Last Rate Last Admin   0.9 %  sodium chloride infusion   Intravenous Continuous Georgette Shell, MD 125 mL/hr at 09/07/22 1508 Infusion Verify at 09/07/22 1508   atorvastatin (LIPITOR) tablet 40 mg  40 mg Oral Daily Ivor Costa, MD   40 mg at 09/07/22 0914   cefTRIAXone (ROCEPHIN) 2 g in sodium chloride 0.9 % 100 mL IVPB  2 g Intravenous Q24H Ivor Costa, MD   Stopping previously hung infusion at 09/06/22 2309   clonazePAM (KLONOPIN) tablet 0.5 mg  0.5 mg Oral BID PRN Ivor Costa, MD       enalapril (VASOTEC) tablet 20 mg  20 mg Oral Daily Emeterio Reeve, DO   20 mg at 09/07/22 0914   enoxaparin (LOVENOX) injection 55 mg  0.5 mg/kg Subcutaneous Q24H Ivor Costa, MD   55 mg at 09/06/22 2129   famotidine (PEPCID) tablet 20 mg  20 mg Oral BID Ivor Costa, MD   20 mg at 09/07/22 0914   gabapentin (NEURONTIN) capsule 300 mg  300 mg Oral QHS Niu,  Soledad Gerlach, MD   300 mg at 09/06/22 2124   hydrALAZINE (APRESOLINE) injection 5 mg  5 mg Intravenous Q2H PRN Ivor Costa, MD       ibuprofen (ADVIL) tablet 200 mg  200 mg Oral Q6H PRN Ivor Costa, MD       insulin aspart (novoLOG) injection 0-5 Units  0-5 Units Subcutaneous QHS Ivor Costa, MD       insulin aspart (novoLOG) injection 0-9 Units  0-9 Units Subcutaneous TID WC Ivor Costa, MD   1 Units at 09/07/22 1142   insulin glargine-yfgn (SEMGLEE) injection 25 Units  25 Units Subcutaneous QHS Ivor Costa, MD   25 Units at 09/06/22 2130   levothyroxine (SYNTHROID) tablet 25 mcg  25 mcg Oral QAC breakfast Ivor Costa, MD   25 mcg at 09/07/22 0914   metroNIDAZOLE  (FLAGYL) IVPB 500 mg  500 mg Intravenous Q12H Ivor Costa, MD   Stopped at 09/07/22 1047   ondansetron (ZOFRAN) injection 4 mg  4 mg Intravenous Q8H PRN Ivor Costa, MD   4 mg at 09/07/22 0915   promethazine (PHENERGAN) 12.5 mg in sodium chloride 0.9 % 50 mL IVPB  12.5 mg Intravenous Q8H PRN Ivor Costa, MD       QUEtiapine (SEROQUEL XR) 24 hr tablet 800 mg  800 mg Oral QHS Ivor Costa, MD   800 mg at 09/06/22 2125   sertraline (ZOLOFT) tablet 100 mg  100 mg Oral Daily Ivor Costa, MD   100 mg at 09/07/22 0914   temazepam (RESTORIL) capsule 30 mg  30 mg Oral QHS PRN Ivor Costa, MD       traMADol Veatrice Bourbon) tablet 50 mg  50 mg Oral TID PRN Ivor Costa, MD         Discharge Medications: Please see discharge summary for a list of discharge medications.  Relevant Imaging Results:  Relevant Lab Results:   Additional Information [phone]  Alberteen Sam, LCSW

## 2022-09-08 DIAGNOSIS — K5792 Diverticulitis of intestine, part unspecified, without perforation or abscess without bleeding: Secondary | ICD-10-CM | POA: Diagnosis not present

## 2022-09-08 LAB — BASIC METABOLIC PANEL
Anion gap: 6 (ref 5–15)
BUN: 13 mg/dL (ref 8–23)
CO2: 24 mmol/L (ref 22–32)
Calcium: 8.3 mg/dL — ABNORMAL LOW (ref 8.9–10.3)
Chloride: 111 mmol/L (ref 98–111)
Creatinine, Ser: 0.85 mg/dL (ref 0.61–1.24)
GFR, Estimated: >60 mL/min (ref >60)
Glucose, Bld: 78 mg/dL (ref 70–99)
Potassium: 3.4 mmol/L — ABNORMAL LOW (ref 3.5–5.1)
Sodium: 141 mmol/L (ref 135–145)

## 2022-09-08 LAB — GLUCOSE, CAPILLARY
Glucose-Capillary: 77 mg/dL (ref 70–99)
Glucose-Capillary: 142 mg/dL — ABNORMAL HIGH (ref 70–99)
Glucose-Capillary: 136 mg/dL — ABNORMAL HIGH (ref 70–99)
Glucose-Capillary: 150 mg/dL — ABNORMAL HIGH (ref 70–99)

## 2022-09-08 MED ORDER — METFORMIN HCL 1000 MG PO TABS: 1000.0000 mg | ORAL_TABLET | Freq: Two times a day (BID) | ORAL | Status: AC

## 2022-09-08 MED ORDER — HYDROCHLOROTHIAZIDE 25 MG PO TABS: 25.0000 mg | ORAL_TABLET | Freq: Every morning | ORAL | Status: AC

## 2022-09-08 MED ORDER — ONDANSETRON 4 MG PO TBDP: 4.0000 mg | ORAL_TABLET | Freq: Three times a day (TID) | ORAL | 0 refills | Status: AC | PRN

## 2022-09-08 MED ORDER — IBUPROFEN 200 MG PO TABS: 200.0000 mg | ORAL_TABLET | Freq: Four times a day (QID) | ORAL | 0 refills | Status: AC | PRN

## 2022-09-08 MED ORDER — LOPERAMIDE HCL 2 MG PO CAPS: 4.0000 mg | ORAL_CAPSULE | ORAL | 0 refills | Status: AC | PRN

## 2022-09-08 MED ORDER — CIPROFLOXACIN HCL 500 MG PO TABS: 500.0000 mg | ORAL_TABLET | Freq: Two times a day (BID) | ORAL | 0 refills | Status: AC

## 2022-09-08 MED ORDER — METRONIDAZOLE 500 MG PO TABS: 500.0000 mg | ORAL_TABLET | Freq: Three times a day (TID) | ORAL | 0 refills | Status: AC

## 2022-09-08 MED ORDER — POTASSIUM CHLORIDE CRYS ER 10 MEQ PO TBCR: 10.0000 meq | EXTENDED_RELEASE_TABLET | Freq: Two times a day (BID) | ORAL | 0 refills | Status: AC

## 2022-09-08 NOTE — Discharge Summary (Signed)
Physician Discharge Summary   Patient: John David MRN: [id number]  DOB: Oct 13, 1951   Admit:     Date of Admission: 09/04/2022 Admitted from: home   Discharge: Date of discharge: 09/08/22 Disposition: Relative's home - declined SNF, accepted HH  Condition at discharge: fair  CODE STATUS: FULL     Discharge Physician: Emeterio Reeve, DO [facility]     PCP: Renee Rival, NP  Recommendations for Outpatient Follow-up:  Follow up with PCP Renee Rival, NP in 1-2 weeks Please obtain labs/tests: CBC, CMP in 1-2 weeks Please follow up on the following pending results: none Needs imaging to follow small L adrenal nodule  PCP AND OTHER OUTPATIENT PROVIDERS: SEE BELOW FOR SPECIFIC DISCHARGE INSTRUCTIONS PRINTED FOR PATIENT IN ADDITION TO GENERIC AVS PATIENT INFO    Discharge Instructions     Diet - low sodium heart healthy   Complete by: As directed    Increase activity slowly   Complete by: As directed          Discharge Diagnoses: Principal Problem:   Acute diverticulitis Active Problems:   Diabetes mellitus without complication ([place])   Hyperlipidemia   Hypertension   Hypothyroidism   Depression   Bipolar disorder ([place])   Obesity with body mass index (BMI) of 30.0 to 39.9   Abnormal LFTs   Hypokalemia   Adrenal nodule [facility])       Hospital Course: 71 year old male with medical history significant of hypertension, hyperlipidemia, diabetes mellitus, GERD, hypothyroidism, depression, bipolar, OSA, obesity obesity BMI 35.15, lives at home alone presented with nausea vomiting diarrhea and abdominal pain - diverticulitis. 09/06: Patient came to the ER for above complaints and was discharged on Zofran.   09/07: returned to ED with persistent symptoms  09/08: He was admitted 09/04/22 with a diagnosis of mild diverticulitis. Treated w/ IV fluids and IV antibiotics.  CT of the abdomen and pelvis showed mild diverticulitis of the  descending colon. Small left adrenal nodule needs outpatient follow-up w/ MRI. 09/09: continued on IV fluids, still having diarrhea 09/10: Still loose stool and frequent. Pt c/o severe weakness when ambulating to bathroom. He feels unsafe for discharge. PT eval ordered.  09/11: PT eval pt is declining SNF, TOC to arrange for [facility] and pt amenable to d/c tomorrow once he has a ride  09/12: VSS. Awaiting transport home, occasional looser stool but diarrhea and abd pain resolved. Safe for DC   Consultants:  none  Procedures: none      ASSESSMENT & PLAN:   Principal Problem:   Acute diverticulitis Active Problems:   Diabetes mellitus without complication ([place])   Hyperlipidemia   Hypertension   Hypothyroidism   Depression   Bipolar disorder ([place])   Obesity with body mass index (BMI) of 30.0 to 39.9   Abnormal LFTs   Hypokalemia   Adrenal nodule (HCC)   Acute diverticulitis Generalized weakness associated w/ acute illness  CT scan showed possible mild diverticulitis.   Patient was clinically dehydrated.  Received IV fluid --> advanced diet as tolerated. IV Rocephin and Flagyl (patient received 1 dose of oral Augmentin in ED) --> cipro/flagyl on discharge to complete clurse  As needed Zofran and morphine Negative C. difficile and GI pathogen panel BCx NG x2d Continues to have mild diarrhea but much improved   Diabetes mellitus without complication ([place]) Patient is taking metformin, Humalog, Levemir 40 units daily at home A1C 6.3, good control  Home meds resumed Glargine insulin 25 units daily  Hyperlipidemia Lipitor  Hypertension Enalapril Hold HCTZ given dehydration  Hypothyroidism Synthroid  Depression Bipolar disorder (HCC) Continue home medications: Klonopin, Seroquel, Zoloft  Obesity with body mass index (BMI) of 30.0 to 39.9 BMI= 35.15   and BW= 111.1 Diet and exercise.   Encourage to lose weight.  Abnormal LFTs Patient has mild abnormal liver  function, likely due to ongoing infection. Avoid using Tylenol Follow outpatient   Hypokalemia  Potassium 3.4 Repleted potassium Check magnesium level --> 2.2 Follow periodic BMP Potassium supplementation for few days outpatient and follow w/ PCP  Adrenal nodule ([place]) CT showed a small LEFT adrenal nodule, cannot be fully characterize.  will need to f/u with PCP as outpt work up     DVT prophylaxis: Lovenox Pertinent IV fluids/nutrition: IV fluids continuous for dehdration --> po advance diet as tolerated  Central lines / invasive devices: none  Code Status: FULL Family Communication: none at this time   Disposition: hopefully stable for discharge soon pending PT eval   TOC needs: pending PT eval may need SNF/HH Barriers to discharge / significant pending items: awaiting arrange [facility]            Discharge Instructions  Allergies as of 09/08/2022   No Known Allergies      Medication List     TAKE these medications    atorvastatin 40 MG tablet Commonly known as: LIPITOR Take 40 mg by mouth daily.   ciprofloxacin 500 MG tablet Commonly known as: CIPRO Take 1 tablet (500 mg total) by mouth 2 (two) times daily for 3 days. Start taking on: September 09, 2022   clonazePAM 0.5 MG tablet Commonly known as: KLONOPIN Take 0.5 mg by mouth 2 (two) times daily as needed for anxiety.   enalapril 20 MG tablet Commonly known as: VASOTEC Take 20 mg by mouth daily.   famotidine 20 MG tablet Commonly known as: PEPCID Take 1 tablet (20 mg total) by mouth 2 (two) times daily.   gabapentin 300 MG capsule Commonly known as: NEURONTIN Take 300 mg by mouth at bedtime.   HumaLOG KwikPen 200 UNIT/ML KwikPen Generic drug: insulin lispro Inject 40 Units into the skin 2 (two) times daily. With breakfast and dinner   hydrochlorothiazide 25 MG tablet Commonly known as: HYDRODIURIL Take 1 tablet (25 mg total) by mouth every morning. HOLD FOR NOW to prevent dehydration.  Restart if BP above 150 top number and/or 90 bottom number What changed: additional instructions   ibuprofen 200 MG tablet Commonly known as: ADVIL Take 1 tablet (200 mg total) by mouth every 6 (six) hours as needed for fever, mild pain or headache.   Levemir FlexTouch 100 UNIT/ML FlexPen Generic drug: insulin detemir 40 Units at bedtime.   levothyroxine 25 MCG tablet Commonly known as: SYNTHROID Take 25 mcg by mouth daily before breakfast. Take on empty stomach with a glass of water at least 30-60 minutes before breakfast   loperamide 2 MG capsule Commonly known as: IMODIUM Take 2 capsules (4 mg total) by mouth as needed for diarrhea or loose stools.   metFORMIN 1000 MG tablet Commonly known as: GLUCOPHAGE Take 1 tablet (1,000 mg total) by mouth 2 (two) times daily with a meal. HOLD FOR NOW to prevent GI upset. Restart in one week at 500 mg (0.5 tablet) twice daily for 3 days then up to 1000 mg (1 tablet) twice daily as usual What changed: additional instructions   metroNIDAZOLE 500 MG tablet Commonly known as: Flagyl Take 1 tablet (500 mg  total) by mouth 3 (three) times daily for 3 days. Start taking on: September 09, 2022   ondansetron 4 MG disintegrating tablet Commonly known as: ZOFRAN-ODT Take 1-2 tablets (4-8 mg total) by mouth every 8 (eight) hours as needed for nausea or vomiting. What changed: how much to take   potassium chloride 10 MEQ tablet Commonly known as: KLOR-CON M Take 1 tablet (10 mEq total) by mouth 2 (two) times daily for 3 days.   QUEtiapine 400 MG 24 hr tablet Commonly known as: SEROQUEL XR Take 800 mg by mouth at bedtime.   sertraline 100 MG tablet Commonly known as: ZOLOFT Take 100 mg by mouth daily.   temazepam 30 MG capsule Commonly known as: RESTORIL Take 30 mg by mouth at bedtime as needed for sleep.   traMADol 50 MG tablet Commonly known as: ULTRAM Take 50 mg by mouth 3 (three) times daily as needed for pain.                Durable Medical Equipment  (From admission, onward)           Start     Ordered   09/08/22 0745  For home use only DME Walker  Once       Question Answer Comment  Patient needs a walker to treat with the following condition Cellulitis   Patient needs a walker to treat with the following condition Sensorimotor neuropathy   Patient needs a walker to treat with the following condition Ambulatory dysfunction   Patient needs a walker to treat with the following condition SOB (shortness of breath)      09/08/22 0744             Follow-up Information     Renee Rival, NP. Schedule an appointment as soon as possible for a visit on 09/07/2022.   Specialty: Nurse Practitioner Why: For ER visit follow-up and reevaluation. Contact information: [address] [place] [address] [phone]                 No Known Allergies   Subjective: pt feeling well this morning, no complaints.    Discharge Exam: BP 137/63 (BP Location: Right Arm)   Pulse 65   Temp 98 F (36.7 C)   Resp 18   Ht '5\' 10"'$  (1.778 m)   Wt 111.1 kg   SpO2 98%   BMI 35.15 kg/m  General: Pt is alert, awake, not in acute distress Cardiovascular: RRR, S1/S2 +, no rubs, no gallops Respiratory: CTA bilaterally, no wheezing, no rhonchi Abdominal: Soft, NT, ND, bowel sounds +WNL Extremities: no edema, no cyanosis     The results of significant diagnostics from this hospitalization (including imaging, microbiology, ancillary and laboratory) are listed below for reference.     Microbiology: Recent Results (from the past 240 hour(s))  C Difficile Quick Screen w PCR reflex     Status: None   Collection Time: 09/04/22  7:42 AM   Specimen: STOOL  Result Value Ref Range Status   C Diff antigen NEGATIVE NEGATIVE Final   C Diff toxin NEGATIVE NEGATIVE Final   C Diff interpretation No C. difficile detected.  Final    Comment: Performed at [facility], [address] [place],  [place] [address]  Culture, blood (Routine X 2) w Reflex to ID Panel     Status: None (Preliminary result)   Collection Time: 09/04/22  7:17 PM   Specimen: BLOOD LEFT HAND  Result Value Ref Range Status  Specimen Description BLOOD LEFT HAND  Final   Special Requests   Final    BOTTLES DRAWN AEROBIC AND ANAEROBIC Blood Culture adequate volume   Culture   Final    NO GROWTH 4 DAYS Performed at [facility], [address] [place], [place] [address]    Report Status PENDING  Incomplete  Culture, blood (Routine X 2) w Reflex to ID Panel     Status: None (Preliminary result)   Collection Time: 09/04/22  7:18 PM   Specimen: BLOOD RIGHT HAND  Result Value Ref Range Status   Specimen Description BLOOD RIGHT HAND  Final   Special Requests   Final    BOTTLES DRAWN AEROBIC AND ANAEROBIC Blood Culture adequate volume   Culture   Final    NO GROWTH 4 DAYS Performed at [facility], [address] [place], [place] [address]    Report Status PENDING  Incomplete  Gastrointestinal Panel by PCR , Stool     Status: None   Collection Time: 09/04/22  9:05 PM  Result Value Ref Range Status   Campylobacter species NOT DETECTED NOT DETECTED Final   Plesimonas shigelloides NOT DETECTED NOT DETECTED Final   Salmonella species NOT DETECTED NOT DETECTED Final   Yersinia enterocolitica NOT DETECTED NOT DETECTED Final   Vibrio species NOT DETECTED NOT DETECTED Final   Vibrio cholerae NOT DETECTED NOT DETECTED Final   Enteroaggregative E coli (EAEC) NOT DETECTED NOT DETECTED Final   Enteropathogenic E coli (EPEC) NOT DETECTED NOT DETECTED Final   Enterotoxigenic E coli (ETEC) NOT DETECTED NOT DETECTED Final   Shiga like toxin producing E coli (STEC) NOT DETECTED NOT DETECTED Final   Shigella/Enteroinvasive E coli (EIEC) NOT DETECTED NOT DETECTED Final   Cryptosporidium NOT DETECTED NOT DETECTED Final   Cyclospora cayetanensis NOT DETECTED NOT DETECTED Final   Entamoeba histolytica NOT  DETECTED NOT DETECTED Final   Giardia lamblia NOT DETECTED NOT DETECTED Final   Adenovirus F40/41 NOT DETECTED NOT DETECTED Final   Astrovirus NOT DETECTED NOT DETECTED Final   Norovirus GI/GII NOT DETECTED NOT DETECTED Final   Rotavirus A NOT DETECTED NOT DETECTED Final   Sapovirus (I, II, IV, and V) NOT DETECTED NOT DETECTED Final    Comment: Performed at [facility], [address] [place], [place] [address]     Labs: BNP (last 3 results) No results for input(s): "BNP" in the last 8760 hours. Basic Metabolic Panel: Recent Labs  Lab 09/02/22 1348 09/04/22 0647 09/05/22 0415 09/06/22 0457 09/08/22 0530  NA 136 136 142 143 141  K 4.0 3.4* 3.3* 3.5 3.4*  CL 102 105 111 113* 111  CO2 21* '22 24 24 24  '$ GLUCOSE 211* 146* 104* 88 78  BUN '21 18 13 13 13  '$ CREATININE 0.98 0.85 0.81 0.86 0.85  CALCIUM 9.8 8.8* 8.4* 7.9* 8.3*  MG  --  2.2  --  2.1  --    Liver Function Tests: Recent Labs  Lab 09/02/22 1348 09/04/22 0647 09/06/22 0457  AST 68* 63* 41  ALT 63* 56* 55*  ALKPHOS 61 54 48  BILITOT 1.1 0.6 0.7  PROT 8.0 6.6 6.0*  ALBUMIN 4.8 3.9 3.4*   Recent Labs  Lab 09/04/22 0647  LIPASE 36   No results for input(s): "AMMONIA" in the last 168 hours. CBC: Recent Labs  Lab 09/02/22 1348 09/04/22 0647 09/05/22 0415 09/06/22 0457  WBC 8.8 8.8 7.5 6.5  NEUTROABS 5.9 6.0  --   --  HGB 15.0 13.0 12.8* 12.1*  HCT 43.9 38.0* 38.2* 36.8*  MCV 95.6 96.0 97.9 98.1  PLT 311 235 198 178   Cardiac Enzymes: No results for input(s): "CKTOTAL", "CKMB", "CKMBINDEX", "TROPONINI" in the last 168 hours. BNP: Invalid input(s): "POCBNP" CBG: Recent Labs  Lab 09/07/22 0833 09/07/22 1123 09/07/22 1617 09/07/22 2207 09/08/22 0816  GLUCAP 133* 135* 112* 95 77   D-Dimer No results for input(s): "DDIMER" in the last 72 hours. Hgb A1c No results for input(s): "HGBA1C" in the last 72 hours. Lipid Profile No results for input(s): "CHOL", "HDL", "LDLCALC", "TRIG",  "CHOLHDL", "LDLDIRECT" in the last 72 hours. Thyroid function studies No results for input(s): "TSH", "T4TOTAL", "T3FREE", "THYROIDAB" in the last 72 hours.  Invalid input(s): "FREET3" Anemia work up No results for input(s): "VITAMINB12", "FOLATE", "FERRITIN", "TIBC", "IRON", "RETICCTPCT" in the last 72 hours. Urinalysis No results found for: "COLORURINE", "APPEARANCEUR", "LABSPEC", "PHURINE", "GLUCOSEU", "HGBUR", "BILIRUBINUR", "KETONESUR", "PROTEINUR", "UROBILINOGEN", "NITRITE", "LEUKOCYTESUR" Sepsis Labs Recent Labs  Lab 09/02/22 1348 09/04/22 0647 09/05/22 0415 09/06/22 0457  WBC 8.8 8.8 7.5 6.5   Microbiology Recent Results (from the past 240 hour(s))  C Difficile Quick Screen w PCR reflex     Status: None   Collection Time: 09/04/22  7:42 AM   Specimen: STOOL  Result Value Ref Range Status   C Diff antigen NEGATIVE NEGATIVE Final   C Diff toxin NEGATIVE NEGATIVE Final   C Diff interpretation No C. difficile detected.  Final    Comment: Performed at [facility], [address] [place], [place] [address]  Culture, blood (Routine X 2) w Reflex to ID Panel     Status: None (Preliminary result)   Collection Time: 09/04/22  7:17 PM   Specimen: BLOOD LEFT HAND  Result Value Ref Range Status   Specimen Description BLOOD LEFT HAND  Final   Special Requests   Final    BOTTLES DRAWN AEROBIC AND ANAEROBIC Blood Culture adequate volume   Culture   Final    NO GROWTH 4 DAYS Performed at [facility], [address] [place], [place] [address]    Report Status PENDING  Incomplete  Culture, blood (Routine X 2) w Reflex to ID Panel     Status: None (Preliminary result)   Collection Time: 09/04/22  7:18 PM   Specimen: BLOOD RIGHT HAND  Result Value Ref Range Status   Specimen Description BLOOD RIGHT HAND  Final   Special Requests   Final    BOTTLES DRAWN AEROBIC AND ANAEROBIC Blood Culture adequate volume   Culture   Final    NO GROWTH 4 DAYS Performed at  [facility], [address] [place], [place] [address]    Report Status PENDING  Incomplete  Gastrointestinal Panel by PCR , Stool     Status: None   Collection Time: 09/04/22  9:05 PM  Result Value Ref Range Status   Campylobacter species NOT DETECTED NOT DETECTED Final   Plesimonas shigelloides NOT DETECTED NOT DETECTED Final   Salmonella species NOT DETECTED NOT DETECTED Final   Yersinia enterocolitica NOT DETECTED NOT DETECTED Final   Vibrio species NOT DETECTED NOT DETECTED Final   Vibrio cholerae NOT DETECTED NOT DETECTED Final   Enteroaggregative E coli (EAEC) NOT DETECTED NOT DETECTED Final   Enteropathogenic E coli (EPEC) NOT DETECTED NOT DETECTED Final   Enterotoxigenic E coli (ETEC) NOT DETECTED NOT DETECTED Final   Shiga like toxin producing E coli (STEC) NOT DETECTED NOT DETECTED Final  Shigella/Enteroinvasive E coli (EIEC) NOT DETECTED NOT DETECTED Final   Cryptosporidium NOT DETECTED NOT DETECTED Final   Cyclospora cayetanensis NOT DETECTED NOT DETECTED Final   Entamoeba histolytica NOT DETECTED NOT DETECTED Final   Giardia lamblia NOT DETECTED NOT DETECTED Final   Adenovirus F40/41 NOT DETECTED NOT DETECTED Final   Astrovirus NOT DETECTED NOT DETECTED Final   Norovirus GI/GII NOT DETECTED NOT DETECTED Final   Rotavirus A NOT DETECTED NOT DETECTED Final   Sapovirus (I, II, IV, and V) NOT DETECTED NOT DETECTED Final    Comment: Performed at [facility], [address] [place], [place] [address]   Imaging CT ABDOMEN PELVIS W CONTRAST  Result Date: 09/04/2022 CLINICAL DATA:  LEFT lower quadrant abdominal pain EXAM: CT ABDOMEN AND PELVIS WITH CONTRAST TECHNIQUE: Multidetector CT imaging of the abdomen and pelvis was performed using the standard protocol following bolus administration of intravenous contrast. RADIATION DOSE REDUCTION: This exam was performed according to the departmental dose-optimization program which includes automated exposure  control, adjustment of the mA and/or kV according to patient size and/or use of iterative reconstruction technique. CONTRAST:  154m OMNIPAQUE IOHEXOL 300 MG/ML  SOLN COMPARISON:  None Available. FINDINGS: Lower chest: Lung bases are clear. Hepatobiliary: No focal hepatic lesion. Postcholecystectomy. No biliary dilatation. Pancreas: Pancreas is normal. No ductal dilatation. No pancreatic inflammation. Spleen: Normal spleen Adrenals/urinary tract: 13 mm rounded enhancing nodule of the LEFT adrenal gland cannot be fully characterized on this CT protocol. Nonenhancing cyst of the RIGHT renal cortex measures 8 mm. Ureters and bladder normal. Stomach/Bowel: Stomach, small bowel, appendix, and cecum are normal. Several diverticula of the descending colon. There is minimal thickening of the peritoneal reflection along the anti mesenteric border of the descending colon. For example image 57/2 and image 59/5. .Marland KitchenVascular/Lymphatic: Abdominal aorta is normal caliber. No periportal or retroperitoneal adenopathy. No pelvic adenopathy. Reproductive: Prostate unremarkable. Other: No free fluid. Musculoskeletal: No aggressive osseous lesion. IMPRESSION: 1. Potential very mild diverticulitis of the descending colon. Subtle findings. 2. Small LEFT adrenal nodule cannot be fully characterize. Consider MRI adrenal protocol for further characterization. Electronically Signed   By: SSuzy BouchardM.D.   On: 09/04/2022 08:57      Time coordinating discharge: over 30 minutes  SIGNED:  NEmeterio ReeveDO [facility]

## 2022-09-08 NOTE — TOC Progression Note (Signed)
Transition of Care [facility]) - Progression Note    Patient Details  Name: John David MRN: [id number] Date of Birth: 1951-10-01  Transition of Care [facility]) CM/SW Contact  Laurena Slimmer, RN Phone Number: 09/08/2022, 12:33 PM  Clinical Narrative:    11:15am  Spoke with patient's Step Daughter Larene Beach @ [phone] to discuss discharge plan. Patient daughter stated she has been estranged from patient since her mother died three years ago. Larene Beach agreeable to what ever plans patient has.   Spoke with patient at bedside. Patient stated his daughter-in-law who will be staying with him, missed her flight. She plans to be here to take him home from the hospital later tonight. Nurse notified.   Cory from [place] notified patient will discharge later this evening. TOC signing off.       Expected Discharge Plan and Services           Expected Discharge Date: 09/07/22                                     Social Determinants of Health (SDOH) Interventions    Readmission Risk Interventions     No data to display

## 2022-09-08 NOTE — Care Management Important Message (Signed)
Important Message  Patient Details  Name: John David MRN: [id number] Date of Birth: [birth year]   Medicare Important Message Given:  Yes     Dannette Barbara 09/08/2022, 1:16 PM

## 2022-09-09 LAB — GLUCOSE, CAPILLARY: Glucose-Capillary: 107 mg/dL — ABNORMAL HIGH (ref 70–99)

## 2022-09-09 LAB — CULTURE, BLOOD (ROUTINE X 2)
Culture: NO GROWTH
Culture: NO GROWTH
Special Requests: ADEQUATE
Special Requests: ADEQUATE

## 2022-09-09 NOTE — Progress Notes (Addendum)
Rolling walker requested via [employer].

## 2022-09-09 NOTE — Progress Notes (Signed)
Patient discharged yesterday but due to transportation issue he stayed overnight. I saw him briefly this morning, no complaints.  Vital signs are stable. All the questions answered.  Stable for discharge  John Ren MD [facility]

## 2022-09-23 ENCOUNTER — Other Ambulatory Visit: Payer: Self-pay | Admitting: Nurse Practitioner

## 2022-09-23 ENCOUNTER — Other Ambulatory Visit (HOSPITAL_COMMUNITY): Payer: Self-pay | Admitting: Nurse Practitioner

## 2022-09-23 DIAGNOSIS — E278 Other specified disorders of adrenal gland: Secondary | ICD-10-CM

## 2022-10-09 ENCOUNTER — Ambulatory Visit
Admission: RE | Admit: 2022-10-09 | Discharge: 2022-10-09 | Disposition: A | Discharge status: Home / Self Care | Payer: Medicare PPO | Source: Ambulatory Visit | Attending: Nurse Practitioner | Admitting: Nurse Practitioner

## 2022-10-09 DIAGNOSIS — E278 Other specified disorders of adrenal gland: Secondary | ICD-10-CM | POA: Diagnosis not present

## 2022-10-09 MED ORDER — GADOBUTROL 1 MMOL/ML IV SOLN
10.0000 mL | Freq: Once | INTRAVENOUS | Status: AC | PRN
  Administered 2022-10-09: 10 mL via INTRAVENOUS

## 2022-10-25 ENCOUNTER — Emergency Department
Admission: EM | Admit: 2022-10-25 | Discharge: 2022-10-25 | Disposition: A | Discharge status: Home / Self Care | Payer: Medicare PPO | Attending: Emergency Medicine | Admitting: Emergency Medicine

## 2022-10-25 ENCOUNTER — Encounter: Payer: Self-pay | Admitting: Intensive Care

## 2022-10-25 ENCOUNTER — Other Ambulatory Visit: Payer: Self-pay

## 2022-10-25 ENCOUNTER — Emergency Department: Payer: Medicare PPO

## 2022-10-25 DIAGNOSIS — S0001XA Abrasion of scalp, initial encounter: Secondary | ICD-10-CM | POA: Insufficient documentation

## 2022-10-25 DIAGNOSIS — W208XXA Other cause of strike by thrown, projected or falling object, initial encounter: Secondary | ICD-10-CM | POA: Insufficient documentation

## 2022-10-25 DIAGNOSIS — I10 Essential (primary) hypertension: Secondary | ICD-10-CM | POA: Insufficient documentation

## 2022-10-25 DIAGNOSIS — E039 Hypothyroidism, unspecified: Secondary | ICD-10-CM | POA: Diagnosis not present

## 2022-10-25 DIAGNOSIS — S060X1A Concussion with loss of consciousness of 30 minutes or less, initial encounter: Secondary | ICD-10-CM | POA: Diagnosis not present

## 2022-10-25 DIAGNOSIS — R42 Dizziness and giddiness: Secondary | ICD-10-CM

## 2022-10-25 DIAGNOSIS — S0990XA Unspecified injury of head, initial encounter: Secondary | ICD-10-CM | POA: Diagnosis present

## 2022-10-25 DIAGNOSIS — E119 Type 2 diabetes mellitus without complications: Secondary | ICD-10-CM | POA: Diagnosis not present

## 2022-10-25 LAB — BASIC METABOLIC PANEL
Anion gap: 7 (ref 5–15)
BUN: 19 mg/dL (ref 8–23)
CO2: 27 mmol/L (ref 22–32)
Calcium: 8.9 mg/dL (ref 8.9–10.3)
Chloride: 103 mmol/L (ref 98–111)
Creatinine, Ser: 1.19 mg/dL (ref 0.61–1.24)
GFR, Estimated: >60 mL/min (ref >60)
Glucose, Bld: 135 mg/dL — ABNORMAL HIGH (ref 70–99)
Potassium: 4 mmol/L (ref 3.5–5.1)
Sodium: 137 mmol/L (ref 135–145)

## 2022-10-25 LAB — CBC
HCT: 38.9 % — ABNORMAL LOW (ref 39.0–52.0)
Hemoglobin: 13.2 g/dL (ref 13.0–17.0)
MCH: 33.2 pg (ref 26.0–34.0)
MCHC: 33.9 g/dL (ref 30.0–36.0)
MCV: 98 fL (ref 80.0–100.0)
Platelets: 211 10*3/uL (ref 150–400)
RBC: 3.97 MIL/uL — ABNORMAL LOW (ref 4.22–5.81)
RDW: 13.1 % (ref 11.5–15.5)
WBC: 5.9 10*3/uL (ref 4.0–10.5)
nRBC: 0 % (ref 0.0–0.2)

## 2022-10-25 LAB — TROPONIN I (HIGH SENSITIVITY): Troponin I (High Sensitivity): 3 ng/L (ref <18)

## 2022-10-25 MED ORDER — MECLIZINE HCL 25 MG PO TABS: 25.0000 mg | ORAL_TABLET | Freq: Three times a day (TID) | ORAL | 0 refills | Status: AC | PRN

## 2022-10-25 MED ORDER — TETANUS-DIPHTH-ACELL PERTUSSIS 5-2.5-18.5 LF-MCG/0.5 IM SUSY
0.5000 mL | PREFILLED_SYRINGE | Freq: Once | INTRAMUSCULAR | Status: DC
  Filled 2022-10-25: qty 0.5

## 2022-10-25 NOTE — ED Provider Notes (Signed)
[facility] Provider Note    Event Date/Time   First MD Initiated Contact with Patient 10/25/22 1534     (approximate)   History   Chief Complaint Head Injury and Loss of Consciousness   HPI  John David is a 71 y.o. male with past medical history of hypertension, hyperlipidemia, diabetes, hypothyroidism, and bipolar disorder who presents to the ED complaining of dizziness.  Patient reports that he has been feeling dizzy with a sensation of the room spinning around him ever since he was hit on the head by a tree branch 3 days ago.  He states that the branch struck him on the top of his head and he lost consciousness, felt nauseous and vomited once when he woke up.  Since then, he has felt like the room is spinning around him when he stands or tries to walk, denies any vision changes, speech changes, numbness, or weakness.  He denies significant headache and has not had any neck pain, denies any injuries to his extremities.     Physical Exam   Triage Vital Signs: ED Triage Vitals  Enc Vitals Group     BP 10/25/22 1402 (!) 130/58     Pulse Rate 10/25/22 1402 90     Resp 10/25/22 1402 14     Temp 10/25/22 1402 97.7 F (36.5 C)     Temp Source 10/25/22 1402 Oral     SpO2 10/25/22 1402 94 %     Weight 10/25/22 1404 220 lb (99.8 kg)     Height 10/25/22 1404 '5\' 10"'$  (1.778 m)     Head Circumference --      Peak Flow --      Pain Score 10/25/22 1404 5     Pain Loc --      Pain Edu? --      Excl. in [place]? --     Most recent vital signs: Vitals:   10/25/22 1402  BP: (!) 130/58  Pulse: 90  Resp: 14  Temp: 97.7 F (36.5 C)  SpO2: 94%    Constitutional: Alert and oriented. Eyes: Conjunctivae are normal. Head: Healing abrasion to right parietal scalp with no step-offs. Nose: No congestion/rhinnorhea. Mouth/Throat: Mucous membranes are moist.  Neck: No midline cervical spine tenderness to palpation, able to rotate neck to 45 degrees  bilaterally without pain. Cardiovascular: Normal rate, regular rhythm. Grossly normal heart sounds.  2+ radial pulses bilaterally. Respiratory: Normal respiratory effort.  No retractions. Lungs CTAB. Gastrointestinal: Soft and nontender. No distention. Musculoskeletal: No lower extremity tenderness nor edema.  Neurologic:  Normal speech and language. No gross focal neurologic deficits are appreciated.    ED Results / Procedures / Treatments   Labs (all labs ordered are listed, but only abnormal results are displayed) Labs Reviewed  BASIC METABOLIC PANEL - Abnormal; Notable for the following components:      Result Value   Glucose, Bld 135 (*)    All other components within normal limits  CBC - Abnormal; Notable for the following components:   RBC 3.97 (*)    HCT 38.9 (*)    All other components within normal limits  URINALYSIS, ROUTINE W REFLEX MICROSCOPIC  TROPONIN I (HIGH SENSITIVITY)     EKG  ED ECG REPORT I, Blake Divine, the attending physician, personally viewed and interpreted this ECG.   Date: 10/25/2022  EKG Time: 14:10  Rate: 88  Rhythm: normal sinus rhythm  Axis: Normal  Intervals:none  ST&T Change: None  RADIOLOGY  CT head reviewed and interpreted by me with no hemorrhage or midline shift.  PROCEDURES:  Critical Care performed: No  Procedures   MEDICATIONS ORDERED IN ED: Medications  Tdap (BOOSTRIX) injection 0.5 mL (has no administration in time range)     IMPRESSION / MDM / ASSESSMENT AND PLAN / ED COURSE  I reviewed the triage vital signs and the nursing notes.                              71 y.o. male with past medical history of hypertension, hyperlipidemia, diabetes, bipolar disorder, and hypothyroidism who presents to the ED complaining of dizziness and feeling like the room is spinning after being struck on the top of his head by a falling tree branch and losing consciousness 3 days ago.  Patient's presentation is most consistent  with acute presentation with potential threat to life or bodily function.  Differential diagnosis includes, but is not limited to, intracranial hemorrhage, cervical spine injury, skull fracture, postconcussive syndrome, electrolyte abnormality, AKI, arrhythmia.  Patient nontoxic-appearing and in no acute distress, vital signs are unremarkable and he has no focal neurologic deficits on exam.  He does have a healing abrasion to the top of his scalp with no significant laceration.  We will update his tetanus, CT head is negative for acute process.  He denies any neck pain and has no cervical spine tenderness whatsoever, doubt significant cervical spine injury.  EKG shows no evidence of arrhythmia or ischemia and labs are unremarkable with no significant leukocytosis, anemia, electrolyte abnormality, or AKI.  Troponin within normal limits and I doubt cardiac etiology for his symptoms.  Suspect ongoing dizziness is due to concussion and patient is appropriate for discharge home with PCP follow-up, will be prescribed meclizine to use as needed.  He was counseled to return to the ED for new or worsening symptoms, patient agrees with plan.      FINAL CLINICAL IMPRESSION(S) / ED DIAGNOSES   Final diagnoses:  Concussion with loss of consciousness of 30 minutes or less, initial encounter  Dizziness  Accidentally struck by falling tree, initial encounter     Rx / DC Orders   ED Discharge Orders          Ordered    meclizine (ANTIVERT) 25 MG tablet  3 times daily PRN        10/25/22 1612             Note:  This document was prepared using Dragon voice recognition software and may include unintentional dictation errors.   Blake Divine, MD 10/25/22 [phone]

## 2022-10-25 NOTE — ED Triage Notes (Signed)
Patient reports he was cutting a dead tree and tree limb fell and hit the top of his head and he had LOC. Incident happened last Thursday 10/22/22. Reports emesis after awakening. Patient has been unsteady on feet and dizzy since getting hit.

## 2023-02-23 ENCOUNTER — Other Ambulatory Visit: Payer: Self-pay | Admitting: Nurse Practitioner

## 2023-02-23 DIAGNOSIS — G629 Polyneuropathy, unspecified: Secondary | ICD-10-CM

## 2023-02-23 DIAGNOSIS — R42 Dizziness and giddiness: Secondary | ICD-10-CM

## 2023-03-04 ENCOUNTER — Ambulatory Visit
Admission: RE | Admit: 2023-03-04 | Discharge: 2023-03-04 | Disposition: A | Discharge status: Home / Self Care | Payer: Medicare PPO | Source: Ambulatory Visit | Attending: Nurse Practitioner | Admitting: Nurse Practitioner

## 2023-03-04 DIAGNOSIS — R42 Dizziness and giddiness: Secondary | ICD-10-CM | POA: Insufficient documentation

## 2023-03-04 DIAGNOSIS — G629 Polyneuropathy, unspecified: Secondary | ICD-10-CM | POA: Diagnosis present

## 2023-03-04 MED ORDER — GADOBUTROL 1 MMOL/ML IV SOLN
9.0000 mL | Freq: Once | INTRAVENOUS | Status: AC | PRN
  Administered 2023-03-04: 9 mL via INTRAVENOUS

## 2023-04-25 ENCOUNTER — Emergency Department: Payer: Medicare PPO

## 2023-04-25 ENCOUNTER — Other Ambulatory Visit: Payer: Self-pay

## 2023-04-25 ENCOUNTER — Emergency Department
Admission: EM | Admit: 2023-04-25 | Discharge: 2023-04-25 | Disposition: A | Discharge status: Home / Self Care | Payer: Medicare PPO | Attending: Emergency Medicine | Admitting: Emergency Medicine

## 2023-04-25 DIAGNOSIS — S91001A Unspecified open wound, right ankle, initial encounter: Secondary | ICD-10-CM | POA: Diagnosis not present

## 2023-04-25 DIAGNOSIS — G629 Polyneuropathy, unspecified: Secondary | ICD-10-CM | POA: Insufficient documentation

## 2023-04-25 DIAGNOSIS — W19XXXA Unspecified fall, initial encounter: Secondary | ICD-10-CM | POA: Insufficient documentation

## 2023-04-25 DIAGNOSIS — S0990XA Unspecified injury of head, initial encounter: Secondary | ICD-10-CM | POA: Diagnosis present

## 2023-04-25 DIAGNOSIS — S90512A Abrasion, left ankle, initial encounter: Secondary | ICD-10-CM | POA: Insufficient documentation

## 2023-04-25 DIAGNOSIS — T148XXA Other injury of unspecified body region, initial encounter: Secondary | ICD-10-CM

## 2023-04-25 MED ORDER — CLINDAMYCIN HCL 150 MG PO CAPS
300.0000 mg | ORAL_CAPSULE | Freq: Once | ORAL | Status: AC
  Administered 2023-04-25: 300 mg via ORAL
  Filled 2023-04-25: qty 2

## 2023-04-25 MED ORDER — CLINDAMYCIN HCL 300 MG PO CAPS: 300.0000 mg | ORAL_CAPSULE | Freq: Three times a day (TID) | ORAL | 0 refills | Status: AC

## 2023-04-25 NOTE — ED Triage Notes (Signed)
Pt to ED for fall on Friday, tripped over shoe. C/o left foot pain and wants wound checked.  +hitting head, denies LOC or blood thinner.

## 2023-04-25 NOTE — Discharge Instructions (Addendum)
Please follow up with your provider and the wound care center.

## 2023-04-25 NOTE — ED Notes (Signed)
Patient has come in with a fall. The patient states he has neuropathy, but this incident was related to tripping on his shoes. Came in with God Daughter.

## 2023-04-25 NOTE — ED Notes (Signed)
Pt foot cleaned and clean dressing placed. Pt given instructions on wound care.

## 2023-04-25 NOTE — ED Provider Notes (Signed)
[facility] Provider Note    Event Date/Time   First MD Initiated Contact with Patient 04/25/23 1253     (approximate)   History   Fall   HPI  John David is a 72 y.o. male with history of diabetes, bipolar, hypokalemia, and hypertension and as listed in EMR presents to the emergency department for treatment and evaluation of left foot pain.  On Friday, he tripped over his shoe while coming back into the house from being outside.  He fell and hit his head and scraped his left foot and both of his arms.  Left foot has become swollen.  He is states he does not feel it is broken but he is concerned about the wounds.  He scraped the plantar aspect of his MCP and has a deep wound in the top of the foot and an abrasion to the ankle.  He states that he is unable to care for this at home because he lives alone and has neuropathy in his hands..      Physical Exam   Triage Vital Signs: ED Triage Vitals  Enc Vitals Group     BP 04/25/23 1233 119/61     Pulse Rate 04/25/23 1233 89     Resp 04/25/23 1233 18     Temp 04/25/23 1233 98 F (36.7 C)     Temp Source 04/25/23 1233 Oral     SpO2 04/25/23 1233 95 %     Weight 04/25/23 1237 235 lb (106.6 kg)     Height 04/25/23 1237 5\' 10"  (1.778 m)     Head Circumference --      Peak Flow --      Pain Score 04/25/23 1236 4     Pain Loc --      Pain Edu? --      Excl. in [place]? --     Most recent vital signs: Vitals:   04/25/23 1233  BP: 119/61  Pulse: 89  Resp: 18  Temp: 98 F (36.7 C)  SpO2: 95%    General: Awake, no distress.  CV:  Good peripheral perfusion.  Resp:  Normal effort.  Abd:  No distention.  Other:        ED Results / Procedures / Treatments   Labs (all labs ordered are listed, but only abnormal results are displayed) Labs Reviewed - No data to display   EKG  Not indicated.   RADIOLOGY  Image and radiology report reviewed and interpreted by me. Radiology report  consistent with the same.  CT head and cervical spine are negative for acute concerns.  PROCEDURES:  Critical Care performed: No  Procedures   MEDICATIONS ORDERED IN ED:  Medications  clindamycin (CLEOCIN) capsule 300 mg (300 mg Oral Given 04/25/23 1419)     IMPRESSION / MDM / ASSESSMENT AND PLAN / ED COURSE   I have reviewed the triage note.  Differential diagnosis includes, but is not limited to, minor head injury, cervical vertebral injury, wound infection, cellulitis.  Patient's presentation is most consistent with acute presentation with potential threat to life or bodily function.  72 year old male presenting to the emergency department for treatment and evaluation of wounds to his foot that he sustained 2 days ago after falling.  He hit his head and scraped his left foot.  He noticed that it was a little red and swollen this morning and decided to come into the emergency department.  CT head and cervical spine are negative for  acute concerns.  Wounds are as pictured above.  He will be placed on antibiotics and given a referral to the wound care center.  He is to schedule an appointment with his primary care provider as well.  ER return precautions were discussed.     FINAL CLINICAL IMPRESSION(S) / ED DIAGNOSES   Final diagnoses:  Minor head injury, initial encounter  Neuropathy  Skin avulsion  Abrasion     Rx / DC Orders   ED Discharge Orders          Ordered    clindamycin (CLEOCIN) 300 MG capsule  3 times daily        04/25/23 1517             Note:  This document was prepared using Dragon voice recognition software and may include unintentional dictation errors.   Chinita Pester, FNP 04/25/23 1656    Minna Antis, MD 04/25/23 1845

## 2023-05-03 ENCOUNTER — Ambulatory Visit (HOSPITAL_COMMUNITY): Payer: Medicare PPO | Attending: Family Medicine | Admitting: Physical Therapy

## 2023-05-03 ENCOUNTER — Other Ambulatory Visit: Payer: Self-pay

## 2023-05-03 ENCOUNTER — Ambulatory Visit: Payer: Medicare PPO | Admitting: Physician Assistant

## 2023-05-03 DIAGNOSIS — M6281 Muscle weakness (generalized): Secondary | ICD-10-CM | POA: Insufficient documentation

## 2023-05-03 DIAGNOSIS — E11621 Type 2 diabetes mellitus with foot ulcer: Secondary | ICD-10-CM | POA: Diagnosis present

## 2023-05-03 DIAGNOSIS — M79672 Pain in left foot: Secondary | ICD-10-CM | POA: Insufficient documentation

## 2023-05-03 DIAGNOSIS — L97521 Non-pressure chronic ulcer of other part of left foot limited to breakdown of skin: Secondary | ICD-10-CM | POA: Insufficient documentation

## 2023-05-03 DIAGNOSIS — Z9181 History of falling: Secondary | ICD-10-CM | POA: Diagnosis present

## 2023-05-03 NOTE — Therapy (Addendum)
OUTPATIENT PHYSICAL THERAPYwound/ neuroEVALUATION   Patient Name: John David MRN: [id number] DOB:1951-04-06, 72 y.o., male Today's Date: 05/03/2023   PCP: Cheron Every REFERRING PROVIDER: Karl Bales, NP   END OF SESSION:  PT End of Session - 05/03/23 0805     Visit Number 1    Number of Visits 12    Date for PT Re-Evaluation 06/14/23    Authorization Type humana    PT Start Time 0815    PT Stop Time 0855    PT Time Calculation (min) 40 min    Activity Tolerance Patient tolerated treatment well             Past Medical History:  Diagnosis Date   Bipolar disorder (HCC)    Depression    Diabetes mellitus without complication (HCC)    Hyperlipidemia    Hypertension    Hypothyroidism    Sleep apnea    Past Surgical History:  Procedure Laterality Date   CHOLECYSTECTOMY     COLONOSCOPY WITH PROPOFOL N/A 05/12/2017   Procedure: COLONOSCOPY WITH PROPOFOL;  Surgeon: Scot Jun, MD;  Location: [facility] ENDOSCOPY;  Service: Endoscopy;  Laterality: N/A;   Patient Active Problem List   Diagnosis Date Noted   Acute diverticulitis 09/04/2022   Hypothyroidism 09/04/2022   Diabetes mellitus without complication (HCC)    Depression    Hyperlipidemia    Bipolar disorder (HCC)    Obesity with body mass index (BMI) of 30.0 to 39.9    Hypokalemia    Abnormal LFTs    Adrenal nodule (HCC)    Hypertension     ONSET DATE: 03/29/23  REFERRING DIAG:  J47.829 (ICD-10-CM) - Pain in left foot   diabetic ulcer to bottom of left foot    THERAPY DIAG:  Diabetic ulcer of left foot associated with type 2 diabetes mellitus, limited to breakdown of skin, unspecified part of foot (HCC)  Pain in left foot  Muscle weakness (generalized) Unsteady gait   Rationale for Evaluation and Treatment: Rehabilitation Pt states last November he was cutting trees when a branch hit him in the head and knocked him out.  He has not walked correctly nor has he been Able to use his  hands ever since.  PT Gait is very unsteady and pt has fallen over 3 times in the past six months.    Wound Therapy - 05/03/23 0001     Subjective Pt states that he has had a wound on his Lt foot for months causing him increased pain.    Patient and Family Stated Goals wound to heal    Prior Treatments self care    Pain Scale 0-10    Pain Score 5     Pain Type Acute pain    Pain Location Foot    Evaluation and Treatment Procedures Explained to Patient/Family Yes    Evaluation and Treatment Procedures agreed to    Wound Properties Date First Assessed: 04/25/23 Time First Assessed: 1657 Location: Foot Location Orientation: Left   Wound Image Images linked: 2    Dressing Type Gauze (Comment)    Dressing Changed New    Dressing Status Intact    Dressing Change Frequency PRN    Site / Wound Assessment Pale    % Wound base Red or Granulating 50%    Wound Length (cm) 1.2 cm    Wound Width (cm) 0.9 cm    Wound Depth (cm) 0.2 cm    Wound Volume (cm^3) 0.22 cm^3  Wound Surface Area (cm^2) 1.08 cm^2    Drainage Amount Minimal    Drainage Description Serous    Treatment Cleansed;Debridement (Selective)    Wound Properties Date First Assessed: 04/25/23 Time First Assessed: 1658 Location: Foot Location Orientation: Anterior;Left   Wound Image Images linked: 1    Dressing Type Gauze (Comment)    Dressing Status Clean, Dry, Intact    Dressing Change Frequency Daily    Site / Wound Assessment Yellow    Wound Length (cm) 2.8 cm    Wound Width (cm) 0.3 cm    Wound Surface Area (cm^2) 0.84 cm^2    Drainage Amount None    Selective Debridement (non-excisional) - Location all wounds    Selective Debridement (non-excisional) - Tools Used Forceps;Scalpel;Scissors    Selective Debridement (non-excisional) - Tissue Removed slough, eschar    Wound Therapy - Clinical Statement see below    Wound Therapy - Functional Problem List --   difficulty bathing, dressing and walking   Factors  Delaying/Impairing Wound Healing Altered sensation;Diabetes Mellitus;Multiple medical problems    Wound Therapy - Frequency 2X / week   x 6 weeks   Wound Therapy - Current Recommendations PT;OT    Wound Plan Pt to recieve cleansing , debridement and dressing change,  Pt will work on strength and balance as well    Dressing  medihoney, 2x2, kerlix and coban to all wounds.            Sit to stand:  5 x in 30 seconds which is poor for pt age  Mm strength:  Quads B 4-                          AT Rt 4-/ LT 4                          Hip flexor 2+ B   Gait is significantly unstable walking in, therapist recommends walking with walker at all times.    PATIENT EDUCATION: Education details: sit to stand 10 x 3 x a day, keep dressing dry, the need to get new shoes.  Person educated: Patient Education method: Explanation and Verbal cues Education comprehension: verbalized understanding   HOME EXERCISE PROGRAM: Sit to stand    GOALS: Goals reviewed with patient? No  SHORT TERM GOALS: Target date: 05/24/23  PT wounds to be 100% granulated Baseline: Goal status: INITIAL  2.  PT to be able to complete 10 sit to stands in 30 seconds to improve strength and gait safety  Baseline:  Goal status: INITIAL  3.  Pt to be able to single leg stance for 5 seconds on both legs to decrease risk of falls.   Baseline:  Goal status: INITIAL   LONG TERM GOALS: Target date: 06/14/23  Pt wounds to be healed to improve gait Baseline:  Goal status: INITIAL  2.  PT to have at least 10 degrees Dorsiflexion B in order to decrease risk of falls.  Baseline:  Goal status: INITIAL  3.  Pt to be able to completed 12 sit to stands in 30 seconds to demonstrate  improved strength in LE mm. Baseline:  Goal status: INITIAL  4.  Pt to be able to single leg stance on both legs for 10 seconds to decrease risk of falling.  Baseline:  Goal status: INITIAL    ASSESSMENT:  CLINICAL IMPRESSION: Patient is  a 72 y.o. m who was seen  today for physical therapy evaluation and treatment for LT foot pain and Lt non healing ulcers.  The pt comes into the department without an assistive device with an extremely unstable gait.  The pt verbalize that he was totally I cutting down trees until November of 23 when a limb fell on his head knocking him out .  He has had difficulty with his walking and can no longer use his hands functionally.  He comes with ill fitting shoes and multiple ulcers on his Left foot.  Therapist explained the importance of good fitting shoes when you are diabetic.  The Pt will be referred to OT for improved UE functional use, he will be seen for wound care as well as balance and strengthening in physical therapy.   Evaluation demonstrates decreased balance, decreased ROM, decreased strength, difficulty in walking, non healing wounds.  Pt will benefit from skilled therapy to improve his safety and functional mobility.   OBJECTIVE IMPAIRMENTS: decreased activity tolerance, decreased balance, difficulty walking, decreased ROM, decreased strength, pain, and decreased skin integrity .   ACTIVITY LIMITATIONS: carrying, lifting, standing, squatting, dressing, hygiene/grooming, locomotion level, and cleaning   PARTICIPATION LIMITATIONS: meal prep, cleaning, driving, shopping, community activity, and dressing   PERSONAL FACTORS: Fitness, Time since onset of injury/illness/exacerbation, and 1-2 comorbidities: DM,  are also affecting patient's functional outcome.   REHAB POTENTIAL: Fair   CLINICAL DECISION MAKING: moderate  EVALUATION COMPLEXITY: Moderate  PLAN: PT FREQUENCY: 2x/week  PT DURATION: 6 weeks  PLANNED INTERVENTIONS: Therapeutic exercises, Therapeutic activity, Neuromuscular re-education, Balance training, Gait training, Patient/Family education, Self Care, and wound care  PLAN FOR NEXT SESSION: please measure DF as pt states he trips on his foot give stretch if limited, wound  care to wounds on LT foot, strengthening, flexibility and balance activity.  Virgina Organ, PT CLT [phone]  05/03/2023, 9:05 AM

## 2023-05-05 ENCOUNTER — Telehealth (HOSPITAL_COMMUNITY): Payer: Self-pay

## 2023-05-05 ENCOUNTER — Ambulatory Visit (HOSPITAL_COMMUNITY): Payer: Medicare PPO

## 2023-05-05 NOTE — Telephone Encounter (Signed)
No show, called and spoke to pt who stated he was unable to afford the drive from [place] and co-pay for wound care. Stated he saw MD this morning and is going to receive home health wound care.   Becky Sax, LPTA/CLT; [place] [phone]

## 2023-05-06 ENCOUNTER — Encounter (HOSPITAL_COMMUNITY): Payer: Self-pay | Admitting: Physical Therapy

## 2023-05-06 NOTE — Therapy (Signed)
PHYSICAL THERAPY DISCHARGE SUMMARY  Visits from Start of Care: 1  Current functional level related to goals / functional outcomes: No change   Remaining deficits: Non healing wound    Education / Equipment: Care for wound    Patient agrees to discharge. Patient goals were not met. Patient is being discharged due to the patient's request. Pt states he can not afford the gas to come to treatment. \ Virgina Organ, PT CLT [phone]

## 2023-05-11 ENCOUNTER — Ambulatory Visit (HOSPITAL_COMMUNITY): Payer: Medicare PPO | Admitting: Physical Therapy

## 2023-05-13 ENCOUNTER — Ambulatory Visit (HOSPITAL_COMMUNITY): Payer: Medicare PPO | Admitting: Physical Therapy

## 2023-05-18 ENCOUNTER — Ambulatory Visit (HOSPITAL_COMMUNITY): Payer: Medicare PPO

## 2023-05-19 ENCOUNTER — Ambulatory Visit (HOSPITAL_COMMUNITY): Payer: Medicare PPO

## 2023-05-20 ENCOUNTER — Ambulatory Visit (HOSPITAL_COMMUNITY): Payer: Medicare PPO | Admitting: Physical Therapy

## 2023-05-21 ENCOUNTER — Ambulatory Visit (HOSPITAL_COMMUNITY): Payer: Medicare PPO

## 2023-05-25 ENCOUNTER — Ambulatory Visit (HOSPITAL_COMMUNITY): Payer: Medicare PPO

## 2023-05-27 ENCOUNTER — Ambulatory Visit (HOSPITAL_COMMUNITY): Payer: Medicare PPO

## 2023-05-31 ENCOUNTER — Ambulatory Visit (HOSPITAL_COMMUNITY): Payer: Medicare PPO | Admitting: Physical Therapy

## 2023-06-03 ENCOUNTER — Ambulatory Visit (HOSPITAL_COMMUNITY): Payer: Medicare PPO | Admitting: Physical Therapy

## 2023-06-08 ENCOUNTER — Ambulatory Visit (HOSPITAL_COMMUNITY): Payer: Medicare PPO

## 2023-06-10 ENCOUNTER — Ambulatory Visit (HOSPITAL_COMMUNITY): Payer: Medicare PPO

## 2023-06-11 ENCOUNTER — Ambulatory Visit (HOSPITAL_COMMUNITY): Payer: Medicare PPO

## 2023-06-15 ENCOUNTER — Ambulatory Visit (HOSPITAL_COMMUNITY): Payer: Medicare PPO

## 2023-06-18 ENCOUNTER — Ambulatory Visit (HOSPITAL_COMMUNITY): Payer: Medicare PPO | Admitting: Physical Therapy

## 2023-06-22 ENCOUNTER — Ambulatory Visit (HOSPITAL_COMMUNITY): Payer: Medicare PPO | Admitting: Physical Therapy

## 2023-06-24 ENCOUNTER — Ambulatory Visit (HOSPITAL_COMMUNITY): Payer: Medicare PPO | Admitting: Physical Therapy

## 2023-06-30 ENCOUNTER — Ambulatory Visit (HOSPITAL_COMMUNITY): Payer: Medicare PPO | Admitting: Physical Therapy
